# Patient Record
Sex: Female | Born: 1980 | Race: Black or African American | Hispanic: No | Marital: Married | Smoking: Never smoker
Health system: Southern US, Community
[De-identification: ages and names within clinical notes are randomized; demographics above are authoritative.]

## PROBLEM LIST (undated history)

## (undated) DIAGNOSIS — F419 Anxiety disorder, unspecified: Secondary | ICD-10-CM

## (undated) DIAGNOSIS — R030 Elevated blood-pressure reading, without diagnosis of hypertension: Secondary | ICD-10-CM

## (undated) DIAGNOSIS — E669 Obesity, unspecified: Secondary | ICD-10-CM

## (undated) DIAGNOSIS — E7800 Pure hypercholesterolemia, unspecified: Secondary | ICD-10-CM

## (undated) DIAGNOSIS — J302 Other seasonal allergic rhinitis: Secondary | ICD-10-CM

## (undated) DIAGNOSIS — S82899A Other fracture of unspecified lower leg, initial encounter for closed fracture: Secondary | ICD-10-CM

## (undated) DIAGNOSIS — C50919 Malignant neoplasm of unspecified site of unspecified female breast: Secondary | ICD-10-CM

## (undated) DIAGNOSIS — M2550 Pain in unspecified joint: Secondary | ICD-10-CM

## (undated) DIAGNOSIS — E559 Vitamin D deficiency, unspecified: Secondary | ICD-10-CM

## (undated) DIAGNOSIS — D219 Benign neoplasm of connective and other soft tissue, unspecified: Secondary | ICD-10-CM

## (undated) DIAGNOSIS — R112 Nausea with vomiting, unspecified: Secondary | ICD-10-CM

## (undated) DIAGNOSIS — Z9889 Other specified postprocedural states: Secondary | ICD-10-CM

## (undated) HISTORY — DX: Anxiety disorder, unspecified: F41.9

## (undated) HISTORY — DX: Obesity, unspecified: E66.9

## (undated) HISTORY — DX: Pain in unspecified joint: M25.50

## (undated) HISTORY — DX: Malignant neoplasm of unspecified site of unspecified female breast: C50.919

## (undated) HISTORY — DX: Pure hypercholesterolemia, unspecified: E78.00

## (undated) HISTORY — DX: Vitamin D deficiency, unspecified: E55.9

## (undated) HISTORY — PX: PARTIAL HYSTERECTOMY: SHX80

---

## 2000-02-02 ENCOUNTER — Encounter: Admission: RE | Admit: 2000-02-02 | Discharge: 2000-02-02 | Payer: Self-pay

## 2000-02-02 ENCOUNTER — Inpatient Hospital Stay (HOSPITAL_COMMUNITY): Admission: AD | Admit: 2000-02-02 | Discharge: 2000-02-02 | Payer: Self-pay | Admitting: Obstetrics

## 2000-03-20 ENCOUNTER — Inpatient Hospital Stay (HOSPITAL_COMMUNITY): Admission: AD | Admit: 2000-03-20 | Discharge: 2000-03-20 | Payer: Self-pay | Admitting: Obstetrics

## 2000-11-14 ENCOUNTER — Other Ambulatory Visit: Admission: RE | Admit: 2000-11-14 | Discharge: 2000-11-14 | Payer: Self-pay | Admitting: Gynecology

## 2001-03-15 ENCOUNTER — Inpatient Hospital Stay (HOSPITAL_COMMUNITY): Admission: AD | Admit: 2001-03-15 | Discharge: 2001-03-15 | Payer: Self-pay | Admitting: Gynecology

## 2001-11-11 ENCOUNTER — Other Ambulatory Visit: Admission: RE | Admit: 2001-11-11 | Discharge: 2001-11-11 | Payer: Self-pay | Admitting: *Deleted

## 2001-11-27 ENCOUNTER — Encounter: Payer: Self-pay | Admitting: *Deleted

## 2001-11-27 ENCOUNTER — Ambulatory Visit (HOSPITAL_COMMUNITY): Admission: RE | Admit: 2001-11-27 | Discharge: 2001-11-27 | Payer: Self-pay | Admitting: *Deleted

## 2002-05-02 ENCOUNTER — Emergency Department (HOSPITAL_COMMUNITY): Admission: EM | Admit: 2002-05-02 | Discharge: 2002-05-02 | Payer: Self-pay | Admitting: Emergency Medicine

## 2002-05-02 ENCOUNTER — Encounter: Payer: Self-pay | Admitting: *Deleted

## 2002-12-24 ENCOUNTER — Encounter: Payer: Self-pay | Admitting: *Deleted

## 2002-12-24 ENCOUNTER — Ambulatory Visit (HOSPITAL_COMMUNITY): Admission: RE | Admit: 2002-12-24 | Discharge: 2002-12-24 | Payer: Self-pay | Admitting: *Deleted

## 2003-07-08 ENCOUNTER — Encounter: Payer: Self-pay | Admitting: Gastroenterology

## 2003-07-08 ENCOUNTER — Encounter: Admission: RE | Admit: 2003-07-08 | Discharge: 2003-07-08 | Payer: Self-pay | Admitting: Gastroenterology

## 2003-11-06 DIAGNOSIS — S82899A Other fracture of unspecified lower leg, initial encounter for closed fracture: Secondary | ICD-10-CM

## 2003-11-06 HISTORY — DX: Other fracture of unspecified lower leg, initial encounter for closed fracture: S82.899A

## 2003-11-06 HISTORY — PX: ANKLE SURGERY: SHX546

## 2004-06-06 ENCOUNTER — Emergency Department (HOSPITAL_COMMUNITY): Admission: EM | Admit: 2004-06-06 | Discharge: 2004-06-06 | Payer: Self-pay

## 2004-06-12 ENCOUNTER — Ambulatory Visit (HOSPITAL_COMMUNITY): Admission: RE | Admit: 2004-06-12 | Discharge: 2004-06-12 | Payer: Self-pay | Admitting: Orthopedic Surgery

## 2005-03-20 IMAGING — CR DG ANKLE PORT 2V*R*
2 series · 2 of 2 positions shown · non-contrast
Comparison: none

CLINICAL DATA: Diffuse right ankle pain following a fall.  
RIGHT ANKLE PORTABLE TWO VIEWS
Portable AP and lateral view of the right ankle were obtained at 4353 hours.  These demonstrate anterior and medial subluxation of the tibia relative to the talus.  Also demonstrated is an oblique fracture of the distal fibula with one shaft width of posterior displacement and almost one shaft width of lateral displacement of the distal fragment as well as posterior and lateral angulation of the distal fragment.
IMPRESSION
Distal fibular fracture and tibial subluxation, as described above.

[view not recorded (1 of 2)]
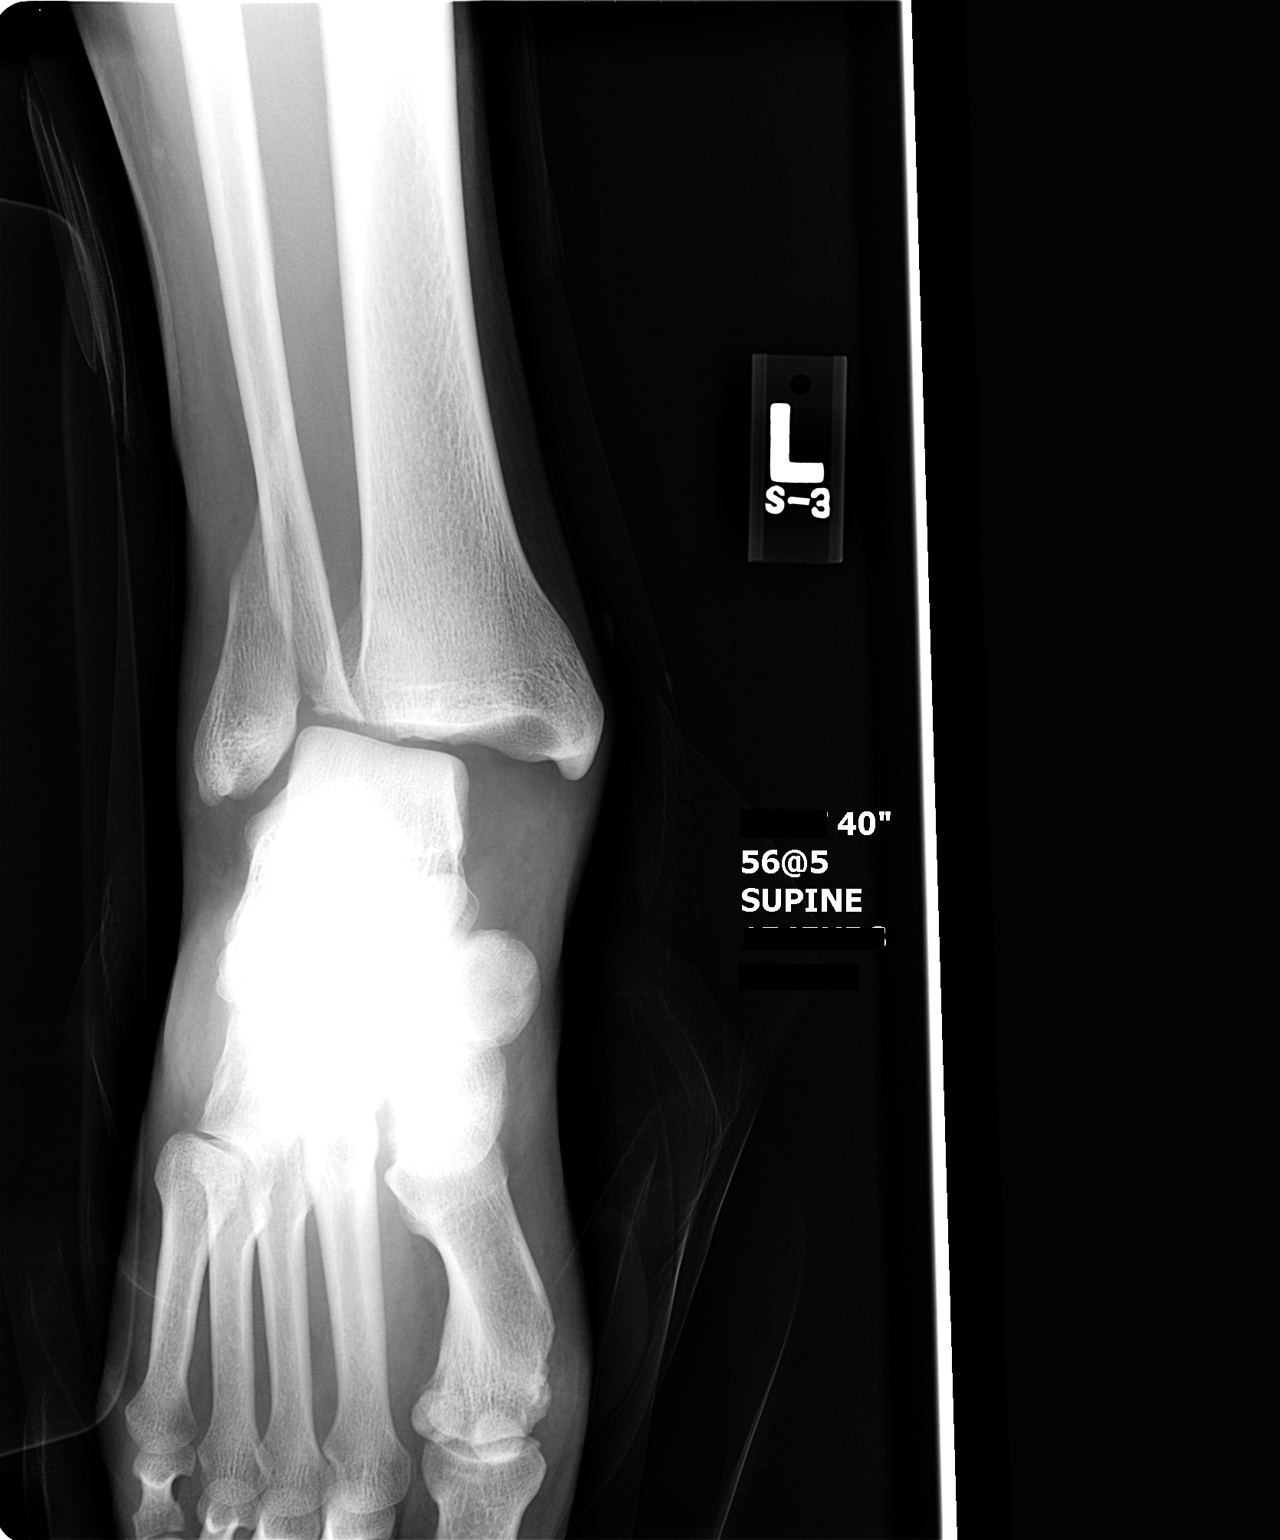

[view not recorded (2 of 2)]
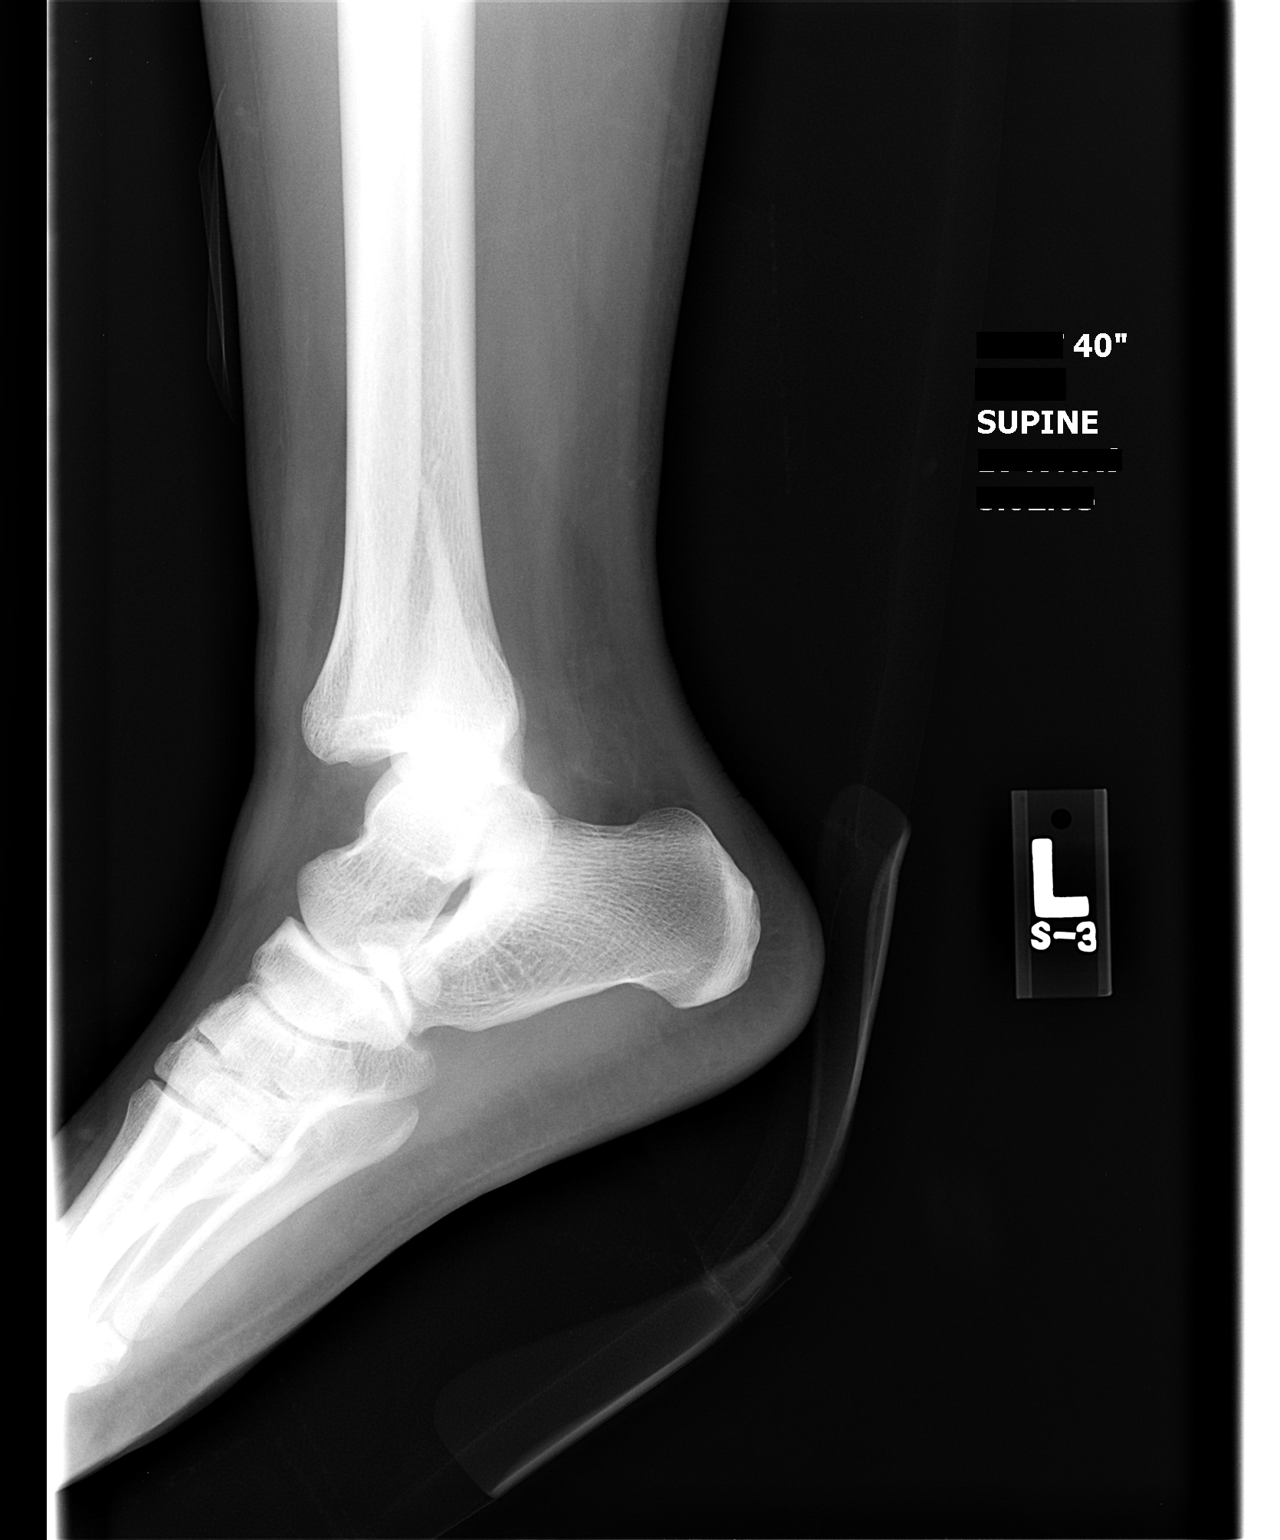

[2 of 2 positions shown; findings below may reference images not displayed]

## 2006-12-01 ENCOUNTER — Inpatient Hospital Stay (HOSPITAL_COMMUNITY): Admission: AD | Admit: 2006-12-01 | Discharge: 2006-12-02 | Payer: Self-pay | Admitting: Family Medicine

## 2006-12-10 ENCOUNTER — Encounter (INDEPENDENT_AMBULATORY_CARE_PROVIDER_SITE_OTHER): Payer: Self-pay | Admitting: Specialist

## 2006-12-10 ENCOUNTER — Inpatient Hospital Stay (HOSPITAL_COMMUNITY): Admission: AD | Admit: 2006-12-10 | Discharge: 2006-12-13 | Payer: Self-pay | Admitting: *Deleted

## 2008-03-17 ENCOUNTER — Other Ambulatory Visit: Admission: RE | Admit: 2008-03-17 | Discharge: 2008-03-17 | Payer: Self-pay | Admitting: Obstetrics and Gynecology

## 2009-03-25 ENCOUNTER — Other Ambulatory Visit: Admission: RE | Admit: 2009-03-25 | Discharge: 2009-03-25 | Payer: Self-pay | Admitting: Obstetrics and Gynecology

## 2010-08-29 ENCOUNTER — Inpatient Hospital Stay (HOSPITAL_COMMUNITY): Admission: RE | Admit: 2010-08-29 | Discharge: 2010-09-01 | Payer: Self-pay | Admitting: Obstetrics and Gynecology

## 2011-01-17 LAB — URINALYSIS, ROUTINE W REFLEX MICROSCOPIC
Bilirubin Urine: NEGATIVE
Glucose, UA: NEGATIVE mg/dL
Ketones, ur: NEGATIVE mg/dL
Nitrite: NEGATIVE
Protein, ur: NEGATIVE mg/dL
Urobilinogen, UA: 0.2 mg/dL (ref 0.0–1.0)
pH: 6 (ref 5.0–8.0)

## 2011-01-17 LAB — CBC
HCT: 37.8 % (ref 36.0–46.0)
MCH: 31.2 pg (ref 26.0–34.0)
MCHC: 34.2 g/dL (ref 30.0–36.0)
MCHC: 35 g/dL (ref 30.0–36.0)
MCV: 88.9 fL (ref 78.0–100.0)
RBC: 3.05 MIL/uL — ABNORMAL LOW (ref 3.87–5.11)
RDW: 14.9 % (ref 11.5–15.5)
WBC: 8.4 10*3/uL (ref 4.0–10.5)

## 2011-03-23 NOTE — Discharge Summary (Signed)
NAME:  Jessica Campos, Jessica Campos NO.:  [id number]   MEDICAL RECORD NO.:  [id number]          PATIENT TYPE:  INP   LOCATION:  9109                          FACILITY:  [facility]   PHYSICIAN:  Gerri Spore B. Earlene Plater, M.D.  DATE OF BIRTH:  1981/07/28   DATE OF ADMISSION:  12/10/2006  DATE OF DISCHARGE:  12/13/2006                               DISCHARGE SUMMARY   ADMISSION DIAGNOSES:  1. A 38-6/7 weeks intrauterine pregnancy.  2. Large uterine fibroid.  3. Severe abdominal pain.   DISCHARGE DIAGNOSES:  1. A 38-6/7 weeks intrauterine pregnancy.  2. Large uterine fibroid.  3. Severe abdominal pain.   PROCEDURE:  1. Admission for induction of labor.  2. Urgent primary C-section for nonreassuring fetal hear rate tracing      (prior to induction start).   HISTORY OF PRESENT ILLNESS:  A 30 year old African-American female  gravida 1 para 0 with a history of large symptomatic fibroid left fundal  region with associated left upper quadrant pain, had been seen in the  office prior to admission and given Vicodin for her pain which did not  resolve.  Given her proximity of 39 weeks, I recommend admission for  induction of labor.   HOSPITAL COURSE:  The patient was admitted to labor and delivery, placed  on the monitor and admission begun.  Prior to starting Pitocin, the  patient was noted to have a spontaneous fetal bradycardia to the 50s  which did recover; however, given the severe nature of the bradycardia  prior to any Pitocin being given, I recommended proceeding with a C-  section.  The patient in agreement.   Subsequently, the patient was delivered by a primary low transverse C-  section, viable female, Apgars 5 and 8, pH 7.08, 6 pounds 3 ounces.  Uterus noted to have multiple subserosus fibroids, the largest being 10  cm at the left cornu with no evidence for infarction.   Postoperatively, the patient rapidly regained her ability to ambulate,  void, and tolerate a regular diet.  She  was discharged home on third  postoperative day in satisfactory condition.   DISCHARGE MEDICATIONS:  Tylox 1-2 p.o. q.4-6h. p.r.n. pain.   FOLLOW UP:  Six weeks, [facility].   DISPOSITION:  Discharged satisfactory.   DISCHARGE INSTRUCTIONS:  Per booklet.      Gerri Spore B. Earlene Plater, M.D.  Electronically Signed     WBD/MEDQ  D:  01/02/2007  T:  01/02/2007  Job:  [id number]

## 2011-03-23 NOTE — Op Note (Signed)
NAME:  Jessica Campos, Jessica Campos NO.:  [id number]   MEDICAL RECORD NO.:  [id number]                   PATIENT TYPE:  OIB   LOCATION:  2899                                 FACILITY:  [facility]   PHYSICIAN:  Mila Homer. Sherlean Foot, M.D.              DATE OF BIRTH:  [birth year]   DATE OF PROCEDURE:  06/12/2004  DATE OF DISCHARGE:  06/12/2004                                 OPERATIVE REPORT   PREOPERATIVE DIAGNOSIS:  Right ankle lateral malleolus fracture.   POSTOPERATIVE DIAGNOSIS:  Right ankle lateral malleolus fracture.   OPERATION PERFORMED:  Right ankle open reduction internal fixation.   SURGEON:  Mila Homer. Sherlean Foot, M.D.   ASSISTANT:  Legrand Pitts. Duffy, P.A.   ANESTHESIA:  General.   INDICATIONS FOR PROCEDURE:  The patient is a young female who fell down the  stairs almost one week ago.  She was taken the emergency room, diagnosed  with an ankle fracture and sent to me.  This was actually a fracture  dislocation of the tibiotalar joint.  Saw her in my office on Friday and put  her on the schedule for today under informed consent.   DESCRIPTION OF PROCEDURE:  The patient was laid supine and administered  general anesthesia.  The right lower extremity was prepped and draped in the  usual sterile fashion.  Straight lateral incision was made with a #10 blade  directly over the fracture, 8 cm in length.  Sharp dissection was taken in  the distal 50% of the wound down to the bone and the proximal 50% used  Metzenbaum dissection to ensure we did not have any injury to the  superficial peroneal nerve.  Then I identified the fracture site and placed  a Bennett retractor anteriorly and posteriorly __________  the fracture.  I  reduced it with a bone reduction forceps.  At this point I placed a lag  screw in standard fashion.  At this point could remove the bone reduction  forceps and the reduction was maintained.  I then fashioned a 7 hole  semitubular plate to the lateral  cortex.  I then placed two cancellous  screws distally, three bicortical screws proximally.  I then evaluated under  C-arm.  The fracture was reduced anatomically; however, with some valgus  torque on the ankle, the medial side opened up but in the neutral position,  closed down and the mortise was anatomic.  I therefore, let the tourniquet  down, dressed with Adaptic, 4 x 4s, sterile Webril and then I put a  posterior and stirrup splint on the ankle with some inversion to help the  deltoid ligament not be under tension.   COMPLICATIONS:  None.   DRAINS:  None.   ESTIMATED BLOOD LOSS:  Minimal.   TOURNIQUET TIME:  33 minutes.  Mila Homer. Sherlean Foot, M.D.    SDL/MEDQ  D:  06/12/2004  T:  06/12/2004  Job:  [id number]

## 2011-03-23 NOTE — Op Note (Signed)
NAME:  Jessica Campos, Jessica Campos NO.:  [id number]   MEDICAL RECORD NO.:  [id number]          PATIENT TYPE:  INP   LOCATION:  9109                          FACILITY:  [facility]   PHYSICIAN:  Gerri Spore B. Earlene Plater, M.D.  DATE OF BIRTH:  11/27/1980   DATE OF PROCEDURE:  12/10/2006  DATE OF DISCHARGE:                               OPERATIVE REPORT   PREOPERATIVE DIAGNOSIS:  1. 38 6/7 week intrauterine pregnancy.  2. Abdominal pain with uterine fibroids.  3. Intermittent fetal bradycardia.   POSTOPERATIVE DIAGNOSIS:  1. 38 6/7 week intrauterine pregnancy.  2. Abdominal pain with uterine fibroids.  3. Intermittent fetal bradycardia.   PROCEDURE:  Primary low transverse cesarean section.   SURGEON:  Chester Holstein. Earlene Plater, M.D.   ASSISTANT:  Marlinda Mike, C.N.M.   ANESTHESIA:  Spinal.   SPECIMENS:  Placenta to pathology.   ESTIMATED BLOOD LOSS:  600 mL.   COMPLICATIONS:  None.   FINDINGS:  Viable female, Apgars 5 and 8, pH 7.08, 6 pounds 3 ounces.  Uterus with multiple subserosal fibroids, the largest being at the left  cornua about 10 cm in diameter with no evidence for infarction.  In  addition, there was a 3-4 cm fibroid in the upper portion of the lower  uterine segment in the midline.  Tubes and ovaries poorly visualized but  no abnormalities noted.   INDICATIONS FOR PROCEDURE:  The patient was admitted today for severe  upper abdominal pain presumably from uterine fibroids not responding to  narcotic medications and the plan was for induction of labor, however,  prior to induction of labor beginning, the patient was on the monitor in  labor and delivery and noted to have spontaneous intermittent fetal  bradycardia to as low as the 50s.  The heart rate did recover to the  130s and we, therefore, proceeded promptly but not as a stat and under  spinal.  The patient advised of the risks of surgery including  infection, bleeding, damage to surrounding organs.   DESCRIPTION OF  PROCEDURE:  The patient was taken to the operating room  and spinal anesthesia was obtained.  She was prepped and draped in a  standard fashion.  The bladder was emptied with a Foley catheter.  A  Pfannenstiel skin incision was made through the previous incision and  carried sharply to the fascia.  The fascia was divided sharply.  The  fascia was elevated and the underlying rectus muscles dissected off  sharply.  The posterior peritoneum elevated and entered sharply.  A  bladder blade was inserted and bladder flap created with sharply.   A uterine incision was made in a low transverse fashion with the knife.  Clear fluid amniotomy.  The incision was extended up laterally with  bandage scissors.  There was a subserosal fibroid about 3 cm cephalad to  the uterine incision in the midline.  The vertex was elevated and with  fundal pressure, the vertex immediately deflexed and would not deliver  through the incision despite the use of the mushroom cup vacuum which  was placed on the midline.  Therefore,  the skin incision was extended.  The rectus muscles also were divided about 50% on the left and about 75%  on the right.  Right Epigastric vessels were identified, clamped, and  divided.  The fascia was similarly extended.  This allowed delivery of  the infant without further difficulty.  In total, three pulls were  necessary with the mushroom cup and there were two pop-offs encountered.  The remainder of the infant was delivered without difficulty, the cord  clamped and cut, nose and mouth suctioned with the bulb, and the infant  handed off to the awaiting pediatricians.  1 gram of Ancef was given at  cord clamp.   The placenta was removed manually, the uterus left in the abdomen and  cleared of all clots and debris.  The uterine incision was free of  extension.  The uterine incision was closed in a running locked stitch  of 0 chromic.  A second imbricating layer was placed of the same  suture  with hemostasis obtained.  The pelvis was irrigated.  The uterine  incision and bladder flap were hemostatic.  The large subserosal fibroid  at the left cornua was inspected and was difficult to see as the patient  was vomiting profusely, however, when isolated and visualized did not  appear to be necrotic.   The fascia was closed with a running stitch of 0 Vicryl.  The  subcutaneous tissues were irrigated and made hemostatic with the Bovie.  The skin was closed with staples.  The patient tolerated the procedure  well and there were no complications.  She was taken to the recovery  room awake, alert, and in stable condition.  All counts were correct per  the operating room staff.      Gerri Spore B. Earlene Plater, M.D.  Electronically Signed     WBD/MEDQ  D:  12/10/2006  T:  12/10/2006  Job:  [id number]

## 2011-03-23 NOTE — Consult Note (Signed)
[facility]  Patient:    Jessica, Campos Visit Number: [id number] MRN: [id number]          Service Type: EMS Location: ED Attending Physician:  Ilene Qua Dictated by:   Candy Sledge, M.D. Proc. Date: 05/02/02 Admit Date:  05/02/2002 Discharge Date: 05/02/2002                            Consultation Report  REFERRING PHYSICIAN:  Earline Mayotte  CHIEF COMPLAINT:  Dizziness, numbness.  HISTORY OF PRESENT ILLNESS:  Jessica Campos is a 30 year old right-handed single female who has been in generally excellent health.  She was driving to work today around noon and started feeling weak and dizzy.  When she arrived at work she felt as though she were going to pass out and developed numbness of her face and hands.  She denies shortness of breath or palpitations at that time.  She had to be helped to the car by her colleagues and was brought to the emergency room.  She apparently was able to mobilize her extremities, however.  In the emergency room she was more "dizzy" without spinning vertigo and had some nausea.  The dizziness improved.  She developed a frontal headache.  The numbness has subsided as well.  The patient underwent an MRI and MRA specifically to rule out vertebral artery dissection and this showed no abnormalities.  Neurology was called to see the patient regarding her persistent dizziness.  The patients mother gives a history of intermittent dizziness since high school.  The patient has no history of migraines.  The mother does have a positive history of vertigo as well.  The patient admits to car sickness.  PAST MEDICAL HISTORY:  Otherwise negative, although she has been evaluated for an enlarged pituitary gland seen on CT scan.  She had an MRI of the brain in January 2003 and was advised to have yearly follow-up MRIs by her gynecologist.  She has had bunion surgery as well.  She takes birth control pills and no other  medications.  ALLERGIES:  There are no known drug allergies.  FAMILY HISTORY:  Positive for vertigo in the patients mother.  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  She does not smoke.  REVIEW OF SYSTEMS:  Generally as above.  The patient has no other complaints referable to the lungs, cardiovascular system, GI, or GU systems.  PHYSICAL EXAMINATION  GENERAL:  This is a generally well-developed, rather petite patient appearing somewhat uncomfortable lying in bed.  VITAL SIGNS:  Blood pressure 125/90, pulse 99 and regular, respirations 20, temperature 98.3.  HEENT:  Cranium is normocephalic and atraumatic.  NECK:  Supple without bruits.  HEART:  Regular rate and rhythm without murmurs.  LUNGS:  Clear to auscultation.  EXTREMITIES:  Without clubbing, cyanosis, edema.  NEUROLOGIC:  The patient is alert and oriented in all spheres.  She appears comfortable and avoids rapid head movements.  Her speech is normal.  Memory and judgment are both intact.  Affect is a bit flat and mood somewhat depressed.  Cranial nerve examination reveals full extraocular movements and a few beats of nystagmus on left lateral gaze.  Pupils are equal, round, and reactive to light and visual fields are full to confrontation.  Funduscopic examination reveals sharp disk margins.  Facies is symmetric and the tongue protrudes in the midline.  Motor testing  revealed negative ravarre.  There is 5/5 strength throughout the upper and lower extremities.  Deep tendon reflexes are 1+ and symmetric at all points and plantar responses are downgoing bilaterally.  Sensory examination is intact to primary modalities.  Cerebellar testing reveals good rapid alternating movements and finger-to-nose.  Gait was not tested.  As the patient sat up, she complained of increased symptoms.  LABORATORIES:  In the emergency room included a CBC, differential, comprehensive  metabolic panel, urinalysis, urine drug screen, and pregnancy test.  All of these were negative or within normal limits.  MRI of the brain and MR angiography of the intra and extracranial circulation showed no evidence of infarct, occlusions, dissection, deep vein thromboses. No specific abnormality of the pituitary gland was noted.  IMPRESSION:  Vertigo.  This could be a migraine equivalent.  The patient does have a history of vertigo since high school.  PLAN:  Discharge the patient with a prescription for Phenergan 25 mg tablets one-half to one q.4-6h. p.r.n. nausea and dizziness.  She is instructed to follow up on a p.r.n. basis with neurology and at some point may benefit from an ENT evaluation.  I appreciate the opportunity to evaluate this interesting patient. Dictated by:   Candy Sledge, M.D. Attending Physician:  Ilene Qua DD:  05/02/02 TD:  05/04/02 Job: [id number]

## 2011-11-06 HISTORY — PX: FOOT SURGERY: SHX648

## 2012-03-17 ENCOUNTER — Ambulatory Visit: Payer: Self-pay | Admitting: Obstetrics and Gynecology

## 2012-03-20 ENCOUNTER — Ambulatory Visit (INDEPENDENT_AMBULATORY_CARE_PROVIDER_SITE_OTHER): Payer: BC Managed Care – PPO | Admitting: Obstetrics and Gynecology

## 2012-03-20 ENCOUNTER — Encounter: Payer: Self-pay | Admitting: Obstetrics and Gynecology

## 2012-03-20 VITALS — BP 116/74 | Ht 63.0 in | Wt 162.0 lb

## 2012-03-20 DIAGNOSIS — Z124 Encounter for screening for malignant neoplasm of cervix: Secondary | ICD-10-CM

## 2012-03-20 NOTE — Progress Notes (Signed)
Last Pap: 2011 WNL: Yes Regular Periods:no Contraception: IUD Mirena 10/2010  Monthly Breast exam:no Tetanus<23yrs:yes Nl.Bladder Function:yes Daily BMs:yes Healthy Diet:yes Calcium:no Mammogram:no Exercise:yes Seatbelt: yes Abuse at home: no Stressful work:yes Sigmoid-colonoscopy: n/a Bone Density: No  PMH: No change FMH: No Change

## 2012-05-19 ENCOUNTER — Ambulatory Visit (INDEPENDENT_AMBULATORY_CARE_PROVIDER_SITE_OTHER): Payer: BC Managed Care – PPO | Admitting: Obstetrics and Gynecology

## 2012-05-19 ENCOUNTER — Encounter: Payer: Self-pay | Admitting: Obstetrics and Gynecology

## 2012-05-19 VITALS — BP 112/72 | Ht 63.0 in | Wt 163.0 lb

## 2012-05-19 DIAGNOSIS — A499 Bacterial infection, unspecified: Secondary | ICD-10-CM

## 2012-05-19 DIAGNOSIS — N760 Acute vaginitis: Secondary | ICD-10-CM | POA: Insufficient documentation

## 2012-05-19 DIAGNOSIS — Z124 Encounter for screening for malignant neoplasm of cervix: Secondary | ICD-10-CM

## 2012-05-19 DIAGNOSIS — Z01419 Encounter for gynecological examination (general) (routine) without abnormal findings: Secondary | ICD-10-CM

## 2012-05-19 DIAGNOSIS — D219 Benign neoplasm of connective and other soft tissue, unspecified: Secondary | ICD-10-CM | POA: Insufficient documentation

## 2012-05-19 DIAGNOSIS — E663 Overweight: Secondary | ICD-10-CM | POA: Insufficient documentation

## 2012-05-19 DIAGNOSIS — D259 Leiomyoma of uterus, unspecified: Secondary | ICD-10-CM

## 2012-05-19 NOTE — Progress Notes (Addendum)
The patient reports:normal menses, no abnormal bleeding, pelvic pain or discharge  Contraception:IUD  Last mammogram: patient has never had a mammogram  Last pap: was normal October  2011  GC/Chlamydia cultures offered: declined HIV/RPR/HbsAg offered:  declined HSV 1 and 2 glycoprotein offered: declined  Menstrual cycle regular and monthly: No: IUD  Menstrual flow normal: No: IUD   Urinary symptoms: none Normal bowel movements: Yes Reports abuse at home: No:   ANNUAL GYNECOLOGIC EXAMINATION   Jessica Campos is a 31 y.o. female, No obstetric history on file., who presents for an annual exam. See above. The patient has a known history of fibroids with the largest fibroid measured 4.2 cm.  A Mirena IUD was placed in December of 2011.  The string cannot be seen. She does not have periods.  She is confident that the IUD has not fallen out.  Prior Hysterectomy: No    History   Social History  . Marital Status: Married    Spouse Name: N/A    Number of Children: N/A  . Years of Education: N/A   Social History Main Topics  . Smoking status: Never Smoker   . Smokeless tobacco: None  . Alcohol Use:   . Drug Use:   . Sexually Active: Yes    Birth Control/ Protection: IUD     mirena 10/2010   Other Topics Concern  . None   Social History Narrative  . None    Menstrual cycle:   LMP: No LMP recorded. Patient is not currently having periods (Reason: IUD).           Cycle: No period with Mirena IUD.  The following portions of the patient's history were reviewed and updated as appropriate: allergies, current medications, past family history, past medical history, past social history, past surgical history and problem list.  Review of Systems Pertinent items are noted in HPI. Breast:Negative for breast lump,nipple discharge or nipple retraction Gastrointestinal: Negative for abdominal pain, change in bowel habits or rectal bleeding Urinary:negative   Objective:    BP 112/72   Ht 5\' 3"  (1.6 m)  Wt 163 lb (73.936 kg)  BMI 28.87 kg/m2    Weight:  Wt Readings from Last 1 Encounters:  05/19/12 163 lb (73.936 kg)          BMI: Body mass index is 28.87 kg/(m^2).  General Appearance: Alert, appropriate appearance for age. No acute distress HEENT: Grossly normal Neck / Thyroid: Supple, no masses, nodes or enlargement Lungs: clear to auscultation bilaterally Back: No CVA tenderness Breast Exam: No masses or nodes.No dimpling, nipple retraction or discharge. Cardiovascular: Regular rate and rhythm. S1, S2, no murmur Gastrointestinal: Soft, non-tender, no masses or organomegaly  ++++++++++++++++++++++++++++++++++++++++++++++++++++++++  Pelvic Exam: External genitalia: normal general appearance Vaginal: normal without tenderness, induration or masses and relaxation: No Cervix: normal appearance, no IUD string seen. Adnexa: normal bimanual exam Uterus: normal size, shape, and consistency Rectovaginal: not indicated  ++++++++++++++++++++++++++++++++++++++++++++++++++++++++  Lymphatic Exam: Non-palpable nodes in neck, clavicular, axillary, or inguinal regions Neurologic: Normal speech, no tremor  Psychiatric: Alert and oriented, appropriate affect.   Wet Prep:   not applicable Urinalysis:  not applicable UPT:           Not done   Assessment:    Normal gyn exam   Overweight or obese: Yes   Pelvic relaxation: No  Fibroids  Lost IUD string  Vaginosis   Plan:    pap smear return annually or prn Contraception:IUD  Patient declines ultrasound to identify  IUD.   Medications prescribed: Boric acid suppositories 600 mg twice each week, #20, refill for one year  STD screen request: none  RPR: No.   HBsAg: No.  Hepatitis C: No.  The updated Pap smear screening guidelines were discussed with the patient. The patient requested that I obtain a Pap smear: Yes.  Kegel exercises discussed: Yes.  Proper diet and regular exercise were  reviewed.  Annual mammograms recommended starting at age 97. Proper breast care was discussed.  Screening colonoscopy is recommended beginning at age 23.  Regular health maintenance was reviewed.  Sleep hygiene was discussed.  Adequate calcium and vitamin D intake was emphasized.  Mylinda Latina.D.

## 2012-05-20 LAB — PAP IG W/ RFLX HPV ASCU

## 2012-05-21 ENCOUNTER — Telehealth: Payer: Self-pay | Admitting: Obstetrics and Gynecology

## 2012-05-21 NOTE — Telephone Encounter (Signed)
TC to pt. LM to return call.  Need alternate pharmacy for Rx Dr AVS prescribed.

## 2012-05-21 NOTE — Telephone Encounter (Signed)
TC from pt. States will call back with pharmacy preference.

## 2012-05-28 ENCOUNTER — Telehealth: Payer: Self-pay | Admitting: Obstetrics and Gynecology

## 2012-05-28 ENCOUNTER — Other Ambulatory Visit: Payer: Self-pay | Admitting: Obstetrics and Gynecology

## 2012-05-28 MED ORDER — NONFORMULARY OR COMPOUNDED ITEM: Status: DC

## 2012-05-28 NOTE — Telephone Encounter (Signed)
TC to pt.  States is currently on crutches and cannot pick up Rx. Checked on delivery.  Pt then states will have husband obtain this weekend.  Rx to [facility].

## 2018-04-25 ENCOUNTER — Encounter (HOSPITAL_BASED_OUTPATIENT_CLINIC_OR_DEPARTMENT_OTHER): Payer: Self-pay | Admitting: *Deleted

## 2018-05-28 ENCOUNTER — Other Ambulatory Visit (HOSPITAL_COMMUNITY): Payer: Self-pay

## 2018-06-03 ENCOUNTER — Encounter (HOSPITAL_BASED_OUTPATIENT_CLINIC_OR_DEPARTMENT_OTHER): Payer: Self-pay

## 2018-06-03 ENCOUNTER — Ambulatory Visit (HOSPITAL_BASED_OUTPATIENT_CLINIC_OR_DEPARTMENT_OTHER): Admit: 2018-06-03 | Payer: Self-pay | Admitting: Obstetrics and Gynecology

## 2018-06-03 SURGERY — HYSTERECTOMY, VAGINAL, LAPAROSCOPY-ASSISTED, WITH SALPINGECTOMY
Anesthesia: General | Laterality: Bilateral

## 2018-09-26 ENCOUNTER — Other Ambulatory Visit: Payer: Self-pay | Admitting: Obstetrics and Gynecology

## 2018-10-09 ENCOUNTER — Encounter (HOSPITAL_COMMUNITY): Payer: Self-pay

## 2018-10-09 NOTE — Patient Instructions (Addendum)
Your procedure is scheduled on: Thursday, Dec. 12, 2019   Surgery Time:  11:45AM-2:45PM   Report to [facility] Main  Entrance    Report to admitting at 9:45 AM   Call this number if you have problems the morning of surgery [phone]   Do not eat food or drink liquids :After Midnight.   Brush your teeth the morning of surgery.   Do NOT smoke after Midnight   Take these medicines the morning of surgery with A SIP OF WATER: None                               You may not have any metal on your body including hair pins, jewelry, and body piercings             Do not wear make-up, lotions, powders, perfumes/cologne, or deodorant             Do not wear nail polish.  Do not shave  48 hours prior to surgery.                Do not bring valuables to the hospital. [place].   Contacts, dentures or bridgework may not be worn into surgery.   Leave suitcase in the car. After surgery it may be brought to your room.    Patients discharged the day of surgery will not be allowed to drive home.   Special Instructions: Bring a copy of your healthcare power of attorney and living will documents         the day of surgery if you haven't scanned them in before.              Please read over the following fact sheets you were given:  [facility] - Preparing for Surgery Before surgery, you can play an important role.  Because skin is not sterile, your skin needs to be as free of germs as possible.  You can reduce the number of germs on your skin by washing with CHG (chlorahexidine gluconate) soap before surgery.  CHG is an antiseptic cleaner which kills germs and bonds with the skin to continue killing germs even after washing. Please DO NOT use if you have an allergy to CHG or antibacterial soaps.  If your skin becomes reddened/irritated stop using the CHG and inform your nurse when you arrive at Short Stay. Do not shave (including  legs and underarms) for at least 48 hours prior to the first CHG shower.  You may shave your face/neck.  Please follow these instructions carefully:  1.  Shower with CHG Soap the night before surgery and the  morning of surgery.  2.  If you choose to wash your hair, wash your hair first as usual with your normal  shampoo.  3.  After you shampoo, rinse your hair and body thoroughly to remove the shampoo.                             4.  Use CHG as you would any other liquid soap.  You can apply chg directly to the skin and wash.  Gently with a scrungie or clean washcloth.  5.  Apply the CHG Soap to your body ONLY FROM THE NECK DOWN.   Do  not use on face/ open                           Wound or open sores. Avoid contact with eyes, ears mouth and   genitals (private parts).                       Wash face,  Genitals (private parts) with your normal soap.             6.  Wash thoroughly, paying special attention to the area where your    surgery  will be performed.  7.  Thoroughly rinse your body with warm water from the neck down.  8.  DO NOT shower/wash with your normal soap after using and rinsing off the CHG Soap.                9.  Pat yourself dry with a clean towel.            10.  Wear clean pajamas.            11.  Place clean sheets on your bed the night of your first shower and do not  sleep with pets. Day of Surgery : Do not apply any lotions/deodorants the morning of surgery.  Please wear clean clothes to the hospital/surgery center.  FAILURE TO FOLLOW THESE INSTRUCTIONS MAY RESULT IN THE CANCELLATION OF YOUR SURGERY  PATIENT SIGNATURE_________________________________  NURSE SIGNATURE__________________________________  ________________________________________________________________________

## 2018-10-13 ENCOUNTER — Encounter (HOSPITAL_COMMUNITY): Payer: Self-pay

## 2018-10-13 ENCOUNTER — Other Ambulatory Visit: Payer: Self-pay

## 2018-10-13 ENCOUNTER — Encounter (HOSPITAL_COMMUNITY)
Admission: RE | Admit: 2018-10-13 | Discharge: 2018-10-13 | Disposition: A | Discharge status: Home / Self Care | Payer: BC Managed Care – PPO | Source: Ambulatory Visit | Attending: Obstetrics and Gynecology | Admitting: Obstetrics and Gynecology

## 2018-10-13 DIAGNOSIS — Z01812 Encounter for preprocedural laboratory examination: Secondary | ICD-10-CM | POA: Insufficient documentation

## 2018-10-13 DIAGNOSIS — D259 Leiomyoma of uterus, unspecified: Secondary | ICD-10-CM | POA: Diagnosis not present

## 2018-10-13 HISTORY — DX: Benign neoplasm of connective and other soft tissue, unspecified: D21.9

## 2018-10-13 HISTORY — DX: Nausea with vomiting, unspecified: R11.2

## 2018-10-13 HISTORY — DX: Other specified postprocedural states: Z98.890

## 2018-10-13 HISTORY — DX: Other seasonal allergic rhinitis: J30.2

## 2018-10-13 HISTORY — DX: Elevated blood-pressure reading, without diagnosis of hypertension: R03.0

## 2018-10-13 HISTORY — DX: Other fracture of unspecified lower leg, initial encounter for closed fracture: S82.899A

## 2018-10-13 LAB — CBC
HEMATOCRIT: 38.8 % (ref 36.0–46.0)
Hemoglobin: 12.8 g/dL (ref 12.0–15.0)
MCH: 30.6 pg (ref 26.0–34.0)
MCHC: 33 g/dL (ref 30.0–36.0)
MCV: 92.8 fL (ref 80.0–100.0)
Platelets: 351 10*3/uL (ref 150–400)
RBC: 4.18 MIL/uL (ref 3.87–5.11)
RDW: 12.9 % (ref 11.5–15.5)
WBC: 5.2 10*3/uL (ref 4.0–10.5)
nRBC: 0 % (ref 0.0–0.2)

## 2018-10-13 LAB — BASIC METABOLIC PANEL
Anion gap: 8 (ref 5–15)
BUN: 10 mg/dL (ref 6–20)
CHLORIDE: 105 mmol/L (ref 98–111)
CO2: 27 mmol/L (ref 22–32)
Calcium: 9.2 mg/dL (ref 8.9–10.3)
Creatinine, Ser: 0.72 mg/dL (ref 0.44–1.00)
GFR calc Af Amer: >60 mL/min (ref >60)
GFR calc non Af Amer: >60 mL/min (ref >60)
Glucose, Bld: 68 mg/dL — ABNORMAL LOW (ref 70–99)
POTASSIUM: 4.3 mmol/L (ref 3.5–5.1)
Sodium: 140 mmol/L (ref 135–145)

## 2018-10-13 NOTE — H&P (Signed)
Jessica Campos is a 37 y.o.  P: 2-0-0-2  who presents for hysterectomy because of menorrhagia, symptomatic fibroids and dysmenorrhea.  Prior to 2 years ago,  the patient was amenorrheic due to having a Mirena IUD.  Her most recent replacement of the Mirena IUD  was August 2018 and since that time she has experienced heavy  and prolonged vaginal bleeding.  Her flow will last for 2-7 weeks requiring, on her heaviest days,  the change of a pad every 2 hours.  She goes on to report pelvic pain  rated 7/10 on a 10 point pain scale, that is worse on the days she bleeds heavy.  Fortunately she has some relief with Ibuprofen 800 mg  (decreases to 4/10) and has used Tramadol for breakthrough pain.  Along with her menstrual symptoms the patient has a constant sensation of pelvic pressure, occasional urinary hesitancy, lower back pain and positional dyspareunia.  She denies any changes however, in her bowel function.  A pelvic ultrasound in November 2019 showed: an anteverted,  fibroid laden uterus measuring 10.7 cm from fundus to external os: actual dimensions were: 7.24 x 6.23 x 8.43 cm, endometrium: 6.52 mm with IUD in LUS/cervix  #7 fibroids: left fundal sub-serosal-4.07 cm; #2 left anterior intramural-1.70 and 1.61 cm; posterior mid-intramural-3.14 cm; calcified posterior sub-serosal fundal-6.06 cm, right anterior fundal subserosal-1.61 cm and right intramural-2.28 cm; right ovary-3.35 cm and left ovary-2.63 cm.   A review of both medical and surgical management options were given to the patient however,  she desires definitive therapy in the form of hysterectomy.   Past Medical History  OB History: G: 2;  P: 2-0-0-2;  C-section 2008 & 2011  GYN History: menarche: 37 YO    LMP: see HPI    Contraception: Mirena IUD   Denies history of abnormal PAP smear or sexually transmitted diseases.   Last PAP smear: 2019-normal with Negative HPV  Medical History: Negative  Surgical History: 1985 Left Eye Surgery;  1997  Left Foot Cystectomy;  1999 Right Foot Bunionectomy;  2013 Right Foot Bone Spur Removal Denies problems with anesthesia or history of blood transfusions  Family History: Diabetes Mellitus, Stroke and Hypertension  Social History: Married and employed as a Scientist, research (physical sciences);  Denies tobacco use and occasionally uses alcohol  Medications: Ibuprofen 800 mg with food every 8 hours prn Mirena IUD  (placed 06/11/17) Zyrtec 5 mg daily prn  No Known Allergies   Denies sensitivity to peanuts, shellfish, soy, latex or adhesives.   ROS: Admits to glasses and occasional urinary hesitancy but denies  headache, vision changes, nasal congestion, dysphagia, tinnitus, dizziness, hoarseness, cough,  chest pain, shortness of breath, nausea, vomiting, diarrhea,constipation,  urinary frequency, urgency  dysuria, hematuria, vaginitis symptoms,  swelling of joints,easy bruising,  myalgias, arthralgias, skin rashes, unexplained weight loss and except as is mentioned in the history of present illness, patient's review of systems is otherwise negative.     Physical Exam  Bp:110/70  P: 66 bpm   R: 16  Temperature: 98.6 degrees F orally    O2Sat: 99% (room air) Weight: 170lbs Height: 5'2"   BMI: 31.1  Neck: supple without masses or thyromegaly Lungs: clear to auscultation Heart: regular rate and rhythm Abdomen: soft, mildly tender in supra-pubic area and no organomegaly Pelvic:EGBUS- wnl; vagina-normal rugae; uterus-irregular and 12-14 weeks  size, cervix without lesions or motion tenderness; adnexae-no tenderness or masses Extremities:  no clubbing, cyanosis or edema   Assesment: Menorrhagia  Dysmenorrhea                      Symptomatic Uterine Fibroids   Disposition: Robotically-assisted hysterectomy was reviewed with the patient.    Benefits of the robotic approach include lesser postoperative pain, less blood loss during surgery, reduced risk of injury to other organs due to  better visualization with a 3-D HD 10 times magnifying camera, shorter hospital stay between 0-1 night and rapid recovery with return to daily routine in 2-3 weeks. Although robotically-assisted hysterectomy has a longer operative time than traditional laparotomy, in a patient with good medical history, the benefits usually outweigh the risks.    Risks include bleeding, infection, injury to other organs, need for laparotomy, transient post-operative facial edema, increased risk of pelvic prolapse associated with any hysterectomy as well as earlier onset of menopause. Preservation or preventative removal of the ovaries was also reviewed and left to the patient's discretion. Finally, the option of supracervical hysterectomy was also discussed with the possible but yet unconfirmed benefits of reduction of pelvic prolapse. If supracervical hysterectomy is performed, Pap smear screening would continue as currently recommended, monthly bleeding is possible despite cauterization of cervical canal and a small but possible risk of cervical fibroid development is present.   Patient informed about FDA warning on use of morcellator dated 02/19/2013.   Discussed: 1. Incidence of post-operative diagnosis of sarcoma in women undergoing a hysterectomy is 2:1000 2. Annual incidence of leiomyosarcoma is 0.64/100,000 women 3. Sarcomas have the highest incidence in women over 65 4. Power morcellation involves risks of spreading tissue / disease. In he case of undiagnosed cancer, it may adversely affect the patient's prognosis.    Patient verbalized  understanding of these risks.  she was given a Miralax Bowel Prep to be completed the day before her surgery.  She has consented to proceed with a Roboticallly Assisted Total Laparoscopic Hysterectomy with Bilateral Salpingectomy and Possible Uterine Morcellation at [facility] on October 16, 2018.      CSN# [id number]   Levander Katzenstein J. Florene Glen, PA-C  for Dr.  Dede Query. Rivard

## 2018-10-13 NOTE — Pre-Procedure Instructions (Signed)
BMP results 10/13/2018 sent to Dr. Cletis Media via epic.

## 2018-10-16 ENCOUNTER — Ambulatory Visit (HOSPITAL_COMMUNITY)
Admission: RE | Admit: 2018-10-16 | Discharge: 2018-10-17 | Disposition: A | Discharge status: Home / Self Care | Payer: BC Managed Care – PPO | Source: Ambulatory Visit | Attending: Obstetrics and Gynecology | Admitting: Obstetrics and Gynecology

## 2018-10-16 ENCOUNTER — Ambulatory Visit (HOSPITAL_BASED_OUTPATIENT_CLINIC_OR_DEPARTMENT_OTHER): Payer: BC Managed Care – PPO | Admitting: Anesthesiology

## 2018-10-16 ENCOUNTER — Encounter (HOSPITAL_COMMUNITY)
Admission: RE | Disposition: A | Discharge status: Home / Self Care | Payer: Self-pay | Source: Ambulatory Visit | Attending: Obstetrics and Gynecology

## 2018-10-16 ENCOUNTER — Encounter (HOSPITAL_COMMUNITY): Payer: Self-pay | Admitting: *Deleted

## 2018-10-16 ENCOUNTER — Other Ambulatory Visit: Payer: Self-pay

## 2018-10-16 DIAGNOSIS — N920 Excessive and frequent menstruation with regular cycle: Secondary | ICD-10-CM | POA: Diagnosis not present

## 2018-10-16 DIAGNOSIS — N736 Female pelvic peritoneal adhesions (postinfective): Secondary | ICD-10-CM | POA: Diagnosis not present

## 2018-10-16 DIAGNOSIS — N946 Dysmenorrhea, unspecified: Secondary | ICD-10-CM | POA: Diagnosis not present

## 2018-10-16 DIAGNOSIS — N838 Other noninflammatory disorders of ovary, fallopian tube and broad ligament: Secondary | ICD-10-CM | POA: Insufficient documentation

## 2018-10-16 DIAGNOSIS — D219 Benign neoplasm of connective and other soft tissue, unspecified: Secondary | ICD-10-CM

## 2018-10-16 DIAGNOSIS — N921 Excessive and frequent menstruation with irregular cycle: Secondary | ICD-10-CM

## 2018-10-16 DIAGNOSIS — D259 Leiomyoma of uterus, unspecified: Secondary | ICD-10-CM | POA: Diagnosis present

## 2018-10-16 DIAGNOSIS — Z793 Long term (current) use of hormonal contraceptives: Secondary | ICD-10-CM | POA: Diagnosis not present

## 2018-10-16 LAB — PREGNANCY, URINE: Preg Test, Ur: NEGATIVE

## 2018-10-16 SURGERY — XI ROBOTIC ASSISTED TOTAL HYSTERECTOMY WITH SALPINGECTOMY
Anesthesia: General | Laterality: Bilateral

## 2018-10-16 MED ORDER — LACTATED RINGERS IV SOLN
INTRAVENOUS | Status: DC
  Administered 2018-10-09: via INTRAVENOUS

## 2018-10-16 MED ORDER — OXYCODONE-ACETAMINOPHEN 5-325 MG PO TABS: 1.0000 | ORAL_TABLET | Freq: Four times a day (QID) | ORAL | 0 refills | Status: AC | PRN

## 2018-10-16 MED ORDER — KETOROLAC TROMETHAMINE 30 MG/ML IJ SOLN
INTRAMUSCULAR | Status: DC | PRN
  Administered 2018-10-16: 30 mg via INTRAMUSCULAR
  Administered 2018-10-16: 30 mg via INTRAVENOUS

## 2018-10-16 MED ORDER — ONDANSETRON HCL 4 MG/2ML IJ SOLN
4.0000 mg | Freq: Once | INTRAMUSCULAR | Status: AC | PRN
  Administered 2018-10-16: 4 mg via INTRAVENOUS

## 2018-10-16 MED ORDER — FENTANYL CITRATE (PF) 100 MCG/2ML IJ SOLN
INTRAMUSCULAR | Status: DC | PRN
  Administered 2018-10-16: 100 ug via INTRAVENOUS
  Administered 2018-10-16: 150 ug via INTRAVENOUS
  Administered 2018-10-16: 100 ug via INTRAVENOUS

## 2018-10-16 MED ORDER — ONDANSETRON HCL 4 MG/2ML IJ SOLN
INTRAMUSCULAR | Status: DC | PRN
  Administered 2018-10-16: 4 mg via INTRAVENOUS

## 2018-10-16 MED ORDER — LACTATED RINGERS IV SOLN
INTRAVENOUS | Status: DC
  Administered 2018-10-16 (×2): via INTRAVENOUS

## 2018-10-16 MED ORDER — LIDOCAINE HCL (CARDIAC) PF 100 MG/5ML IV SOSY
PREFILLED_SYRINGE | INTRAVENOUS | Status: DC | PRN
  Administered 2018-10-16: 80 mg via INTRAVENOUS

## 2018-10-16 MED ORDER — FENTANYL CITRATE (PF) 100 MCG/2ML IJ SOLN
INTRAMUSCULAR | Status: AC
  Administered 2018-10-16: 50 ug via INTRAVENOUS
  Filled 2018-10-16: qty 2

## 2018-10-16 MED ORDER — ONDANSETRON HCL 4 MG/2ML IJ SOLN: 4.0000 mg | Freq: Four times a day (QID) | INTRAMUSCULAR | Status: DC | PRN

## 2018-10-16 MED ORDER — FENTANYL CITRATE (PF) 100 MCG/2ML IJ SOLN
25.0000 ug | INTRAMUSCULAR | Status: DC | PRN
  Administered 2018-10-16: 50 ug via INTRAVENOUS

## 2018-10-16 MED ORDER — SODIUM CHLORIDE 0.9 % IV SOLN
2.0000 g | INTRAVENOUS | Status: AC
  Administered 2018-10-16: 2 g via INTRAVENOUS
  Filled 2018-10-16: qty 2

## 2018-10-16 MED ORDER — FENTANYL CITRATE (PF) 250 MCG/5ML IJ SOLN
INTRAMUSCULAR | Status: AC
  Filled 2018-10-16: qty 5

## 2018-10-16 MED ORDER — ONDANSETRON HCL 4 MG/2ML IJ SOLN
INTRAMUSCULAR | Status: AC
  Filled 2018-10-16: qty 2

## 2018-10-16 MED ORDER — PROPOFOL 10 MG/ML IV BOLUS
INTRAVENOUS | Status: AC
  Filled 2018-10-16: qty 40

## 2018-10-16 MED ORDER — IBUPROFEN 600 MG PO TABS: ORAL_TABLET | ORAL | 1 refills | Status: DC

## 2018-10-16 MED ORDER — MENTHOL 3 MG MT LOZG: 1.0000 | LOZENGE | OROMUCOSAL | Status: DC | PRN

## 2018-10-16 MED ORDER — STERILE WATER FOR IRRIGATION IR SOLN
Status: DC | PRN
  Administered 2018-10-16: 1000 mL via INTRAVESICAL

## 2018-10-16 MED ORDER — SUGAMMADEX SODIUM 200 MG/2ML IV SOLN
INTRAVENOUS | Status: DC | PRN
  Administered 2018-10-16: 200 mg via INTRAVENOUS

## 2018-10-16 MED ORDER — SIMETHICONE 80 MG PO CHEW
80.0000 mg | CHEWABLE_TABLET | Freq: Four times a day (QID) | ORAL | Status: DC | PRN
  Administered 2018-10-16: 80 mg via ORAL
  Filled 2018-10-16: qty 1

## 2018-10-16 MED ORDER — MIDAZOLAM HCL 2 MG/2ML IJ SOLN
INTRAMUSCULAR | Status: AC
  Filled 2018-10-16: qty 2

## 2018-10-16 MED ORDER — PROPOFOL 10 MG/ML IV BOLUS
INTRAVENOUS | Status: DC | PRN
  Administered 2018-10-16: 140 mg via INTRAVENOUS

## 2018-10-16 MED ORDER — DEXAMETHASONE SODIUM PHOSPHATE 10 MG/ML IJ SOLN
INTRAMUSCULAR | Status: DC | PRN
  Administered 2018-10-16: 5 mg via INTRAVENOUS

## 2018-10-16 MED ORDER — PROMETHAZINE HCL 25 MG/ML IJ SOLN: 25.0000 mg | Freq: Once | INTRAMUSCULAR | Status: DC

## 2018-10-16 MED ORDER — FENTANYL CITRATE (PF) 100 MCG/2ML IJ SOLN
50.0000 ug | Freq: Once | INTRAMUSCULAR | Status: AC
  Administered 2018-10-16: 50 ug via INTRAVENOUS
  Filled 2018-10-16: qty 2

## 2018-10-16 MED ORDER — MIDAZOLAM HCL 5 MG/5ML IJ SOLN
INTRAMUSCULAR | Status: DC | PRN
  Administered 2018-10-16: 2 mg via INTRAVENOUS

## 2018-10-16 MED ORDER — PHENYLEPHRINE HCL 10 MG/ML IJ SOLN
INTRAMUSCULAR | Status: DC | PRN
  Administered 2018-10-16 (×8): 100 ug via INTRAVENOUS

## 2018-10-16 MED ORDER — ONDANSETRON HCL 4 MG PO TABS: 4.0000 mg | ORAL_TABLET | Freq: Three times a day (TID) | ORAL | Status: DC | PRN

## 2018-10-16 MED ORDER — FENTANYL CITRATE (PF) 100 MCG/2ML IJ SOLN
INTRAMUSCULAR | Status: AC
  Filled 2018-10-16: qty 2

## 2018-10-16 MED ORDER — SODIUM CHLORIDE 0.9 % IV SOLN
INTRAVENOUS | Status: DC | PRN
  Administered 2018-10-16: 50 mL
  Administered 2018-10-16: 60 mL
  Administered 2018-10-16: 10 mL

## 2018-10-16 MED ORDER — ROCURONIUM BROMIDE 100 MG/10ML IV SOLN
INTRAVENOUS | Status: DC | PRN
  Administered 2018-10-16: 30 mg via INTRAVENOUS
  Administered 2018-10-16: 50 mg via INTRAVENOUS
  Administered 2018-10-16: 20 mg via INTRAVENOUS

## 2018-10-16 MED ORDER — SODIUM CHLORIDE 0.9 % IR SOLN
Status: DC | PRN
  Administered 2018-10-16: 3000 mL

## 2018-10-16 MED ORDER — OXYCODONE-ACETAMINOPHEN 5-325 MG PO TABS
1.0000 | ORAL_TABLET | ORAL | Status: DC | PRN
  Administered 2018-10-16 – 2018-10-17 (×3): 1 via ORAL
  Filled 2018-10-16 (×3): qty 1

## 2018-10-16 MED ORDER — PROMETHAZINE HCL 25 MG/ML IJ SOLN
12.5000 mg | Freq: Once | INTRAMUSCULAR | Status: AC
  Administered 2018-10-16: 12.5 mg via INTRAVENOUS
  Filled 2018-10-16: qty 1

## 2018-10-16 SURGICAL SUPPLY — 59 items
BARRIER ADHS 3X4 INTERCEED (GAUZE/BANDAGES/DRESSINGS) ×3 IMPLANT
BLADE LAP MORCELLATOR 15MMX9.5 (ELECTROSURGICAL)
BLADE LAP MORCELLATOR 15X9.5 (ELECTROSURGICAL) IMPLANT
BLADE MORCELLATOR EXT  12.5X15 (ELECTROSURGICAL)
BLADE MORCELLATOR EXT 12.5X15 (ELECTROSURGICAL) IMPLANT
CANISTER SUCT 3000ML PPV (MISCELLANEOUS) ×3 IMPLANT
CATH FOLEY 3WAY  5CC 16FR (CATHETERS) ×2
CATH FOLEY 3WAY 5CC 16FR (CATHETERS) ×1 IMPLANT
CLOSURE WOUND 1/4 X3 (GAUZE/BANDAGES/DRESSINGS) ×1
COVER BACK TABLE 60X90IN (DRAPES) ×3 IMPLANT
COVER SURGICAL LIGHT HANDLE (MISCELLANEOUS) ×3 IMPLANT
COVER TIP SHEARS 8 DVNC (MISCELLANEOUS) ×1 IMPLANT
COVER TIP SHEARS 8MM DA VINCI (MISCELLANEOUS) ×2
DECANTER SPIKE VIAL GLASS SM (MISCELLANEOUS) ×6 IMPLANT
DEFOGGER SCOPE WARMER CLEARIFY (MISCELLANEOUS) ×3 IMPLANT
DERMABOND ADVANCED (GAUZE/BANDAGES/DRESSINGS) ×2
DERMABOND ADVANCED .7 DNX12 (GAUZE/BANDAGES/DRESSINGS) ×1 IMPLANT
DRAPE ARM DVNC X/XI (DISPOSABLE) ×4 IMPLANT
DRAPE COLUMN DVNC XI (DISPOSABLE) ×1 IMPLANT
DRAPE DA VINCI XI ARM (DISPOSABLE) ×8
DRAPE DA VINCI XI COLUMN (DISPOSABLE) ×2
DURAPREP 26ML APPLICATOR (WOUND CARE) ×3 IMPLANT
ELECT REM PT RETURN 9FT ADLT (ELECTROSURGICAL) ×3
ELECTRODE REM PT RTRN 9FT ADLT (ELECTROSURGICAL) ×1 IMPLANT
GLOVE BIO SURGEON STRL SZ7 (GLOVE) ×3 IMPLANT
GLOVE BIOGEL PI IND STRL 7.0 (GLOVE) ×5 IMPLANT
GLOVE BIOGEL PI INDICATOR 7.0 (GLOVE) ×10
GLOVE ECLIPSE 6.5 STRL STRAW (GLOVE) ×9 IMPLANT
IRRIG SUCT STRYKERFLOW 2 WTIP (MISCELLANEOUS) ×3
IRRIGATION SUCT STRKRFLW 2 WTP (MISCELLANEOUS) ×1 IMPLANT
LEGGING LITHOTOMY PAIR STRL (DRAPES) ×3 IMPLANT
OBTURATOR OPTICAL STANDARD 8MM (TROCAR) ×2
OBTURATOR OPTICAL STND 8 DVNC (TROCAR) ×1
OBTURATOR OPTICALSTD 8 DVNC (TROCAR) ×1 IMPLANT
OCCLUDER COLPOPNEUMO (BALLOONS) ×3 IMPLANT
PACK ROBOT WH (CUSTOM PROCEDURE TRAY) ×3 IMPLANT
PACK ROBOTIC GOWN (GOWN DISPOSABLE) ×3 IMPLANT
PACK TRENDGUARD 450 HYBRID PRO (MISCELLANEOUS) ×1 IMPLANT
PAD PREP 24X48 CUFFED NSTRL (MISCELLANEOUS) ×3 IMPLANT
PROTECTOR NERVE ULNAR (MISCELLANEOUS) ×9 IMPLANT
SEAL CANN UNIV 5-8 DVNC XI (MISCELLANEOUS) ×4 IMPLANT
SEAL XI 5MM-8MM UNIVERSAL (MISCELLANEOUS) ×8
SEALER VESSEL DA VINCI XI (MISCELLANEOUS)
SEALER VESSEL EXT DVNC XI (MISCELLANEOUS) IMPLANT
SET CYSTO W/LG BORE CLAMP LF (SET/KITS/TRAYS/PACK) ×3 IMPLANT
SET TRI-LUMEN FLTR TB AIRSEAL (TUBING) ×3 IMPLANT
STRIP CLOSURE SKIN 1/4X3 (GAUZE/BANDAGES/DRESSINGS) ×2 IMPLANT
SUT MNCRL AB 3-0 PS2 18 (SUTURE) ×6 IMPLANT
SUT MNCRL AB 3-0 PS2 27 (SUTURE) ×6 IMPLANT
SUT VIC AB 0 CT1 27 (SUTURE) ×4
SUT VIC AB 0 CT1 27XBRD ANBCTR (SUTURE) ×2 IMPLANT
SUT VICRYL 0 UR6 27IN ABS (SUTURE) ×3 IMPLANT
SUT VLOC 180 0 9IN  GS21 (SUTURE) ×8
SUT VLOC 180 0 9IN GS21 (SUTURE) ×4 IMPLANT
TIP UTERINE 6.7X10CM GRN DISP (MISCELLANEOUS) ×3 IMPLANT
TOWEL OR 17X26 10 PK STRL BLUE (TOWEL DISPOSABLE) ×3 IMPLANT
TRENDGUARD 450 HYBRID PRO PACK (MISCELLANEOUS) ×3
TROCAR PORT AIRSEAL 8X120 (TROCAR) ×3 IMPLANT
WATER STERILE IRR 1000ML POUR (IV SOLUTION) ×3 IMPLANT

## 2018-10-16 NOTE — Progress Notes (Signed)
Phone call to Dr. Cletis Media per request for update on patient status/condition. Informed Dr. Cletis Media of patient's lethargy and continued nausea, poor PO intake with only sips and ice chips. Discussed medication for pain with percocet per order and use of perineal ice pack and Kpad for abdominal pain. Also informed Dr. Cletis Media of patient only getting OOB to void and report from day shift that patient had one walk in hallway only d/t vomiting. Will continue to monitor.

## 2018-10-16 NOTE — Anesthesia Postprocedure Evaluation (Signed)
Anesthesia Post Note  Patient: Jessica Campos  Procedure(s) Performed: XI ROBOTIC ASSISTED TOTAL HYSTERECTOMY WITH SALPINGECTOMY LYSIS OF ADHESIONS (Bilateral )     Patient location during evaluation: PACU Anesthesia Type: General Level of consciousness: awake and alert Pain management: pain level controlled Vital Signs Assessment: post-procedure vital signs reviewed and stable Respiratory status: spontaneous breathing, nonlabored ventilation, respiratory function stable and patient connected to nasal cannula oxygen Cardiovascular status: blood pressure returned to baseline and stable Postop Assessment: no apparent nausea or vomiting Anesthetic complications: no    Last Vitals:  Vitals:   10/16/18 1700 10/16/18 1727  BP: 120/87 (!) 155/96  Pulse: 66 (!) 58  Resp: 17 16  Temp: 36.8 C (!) 36.3 C  SpO2: 98% 99%    Last Pain:  Vitals:   10/16/18 1727  TempSrc: Oral  PainSc: 5                  Owain Eckerman L Teyona Nichelson

## 2018-10-16 NOTE — Anesthesia Preprocedure Evaluation (Addendum)
Anesthesia Evaluation  Patient identified by MRN, date of birth, ID band Patient awake    Reviewed: Allergy & Precautions, NPO status , Patient's Chart, lab work & pertinent test results  History of Anesthesia Complications (+) PONV  Airway Mallampati: II  TM Distance: >3 FB Neck ROM: Full    Dental  (+) Dental Advisory Given   Pulmonary neg pulmonary ROS,    breath sounds clear to auscultation       Cardiovascular negative cardio ROS   Rhythm:Regular Rate:Normal     Neuro/Psych negative neurological ROS     GI/Hepatic negative GI ROS, Neg liver ROS,   Endo/Other  negative endocrine ROS  Renal/GU negative Renal ROS     Musculoskeletal   Abdominal   Peds  Hematology negative hematology ROS (+)   Anesthesia Other Findings   Reproductive/Obstetrics                           Lab Results  Component Value Date   WBC 5.2 10/13/2018   HGB 12.8 10/13/2018   HCT 38.8 10/13/2018   MCV 92.8 10/13/2018   PLT 351 10/13/2018   Lab Results  Component Value Date   CREATININE 0.72 10/13/2018   BUN 10 10/13/2018   NA 140 10/13/2018   K 4.3 10/13/2018   CL 105 10/13/2018   CO2 27 10/13/2018    Anesthesia Physical Anesthesia Plan  ASA: II  Anesthesia Plan: General   Post-op Pain Management:    Induction: Intravenous  PONV Risk Score and Plan: 4 or greater and Dexamethasone, Ondansetron, Treatment may vary due to age or medical condition, Scopolamine patch - Pre-op and Midazolam  Airway Management Planned: Oral ETT  Additional Equipment:   Intra-op Plan:   Post-operative Plan: Extubation in OR  Informed Consent: I have reviewed the patients History and Physical, chart, labs and discussed the procedure including the risks, benefits and alternatives for the proposed anesthesia with the patient or authorized representative who has indicated his/her understanding and acceptance.    Dental advisory given  Plan Discussed with: CRNA  Anesthesia Plan Comments:        Anesthesia Quick Evaluation

## 2018-10-16 NOTE — Discharge Instructions (Signed)
Call [facility] @ [phone] if: ° °You have a temperature greater than or equal to 100.4 degrees Farenheit orally °You have pain that is not made better by the pain medication given and taken as directed °You have excessive bleeding or problems urinating ° °Take Colace (Docusate Sodium/Stool Softener) 100 mg 2-3 times daily while taking narcotic pain medicine to avoid constipation or until bowel movements are regular. °Take Ibuprofen 600 mg with food every 6 hours for 5 days then as needed for pain ° °You may drive after 1 week °You may walk up steps ° °You may shower tomorrow °You may resume a regular diet ° °Keep incisions clean and dry °Do not lift over 15 pounds for 6 weeks °Avoid anything in vagina for 6 weeks (or until after your post-operative visit) ° °

## 2018-10-16 NOTE — Transfer of Care (Signed)
Immediate Anesthesia Transfer of Care Note  Patient: Jessica Campos  Procedure(s) Performed: XI ROBOTIC ASSISTED TOTAL HYSTERECTOMY WITH SALPINGECTOMY LYSIS OF ADHESIONS (Bilateral )  Patient Location: PACU  Anesthesia Type:General  Level of Consciousness: awake, alert  and oriented  Airway & Oxygen Therapy: Patient Spontanous Breathing and Patient connected to face mask oxygen  Post-op Assessment: Report given to RN and Post -op Vital signs reviewed and stable  Post vital signs: Reviewed and stable  Last Vitals:  Vitals Value Taken Time  BP 129/75 10/16/2018  4:03 PM  Temp    Pulse 72 10/16/2018  4:06 PM  Resp 15 10/16/2018  4:06 PM  SpO2 98 % 10/16/2018  4:06 PM  Vitals shown include unvalidated device data.  Last Pain:  Vitals:   10/16/18 0957  TempSrc: Oral  PainSc: 0-No pain         Complications: No apparent anesthesia complications

## 2018-10-16 NOTE — Interval H&P Note (Signed)
History and Physical Interval Note:  10/16/2018 12:10 PM  Jessica Campos  has presented today for surgery, with the diagnosis of Uterine Fibroids, Pelvic Pain, Dyspareunia  The various methods of treatment have been discussed with the patient and family. After consideration of risks, benefits and other options for treatment, the patient has consented to  Procedure(s): XI ROBOTIC ASSISTED TOTAL HYSTERECTOMY WITH SALPINGECTOMY (Bilateral) as a surgical intervention .  The patient's history has been reviewed, patient examined, no change in status, stable for surgery.  I have reviewed the patient's chart and labs.  Questions were answered to the patient's satisfaction.     Katharine Look A Cylie Dor

## 2018-10-16 NOTE — Anesthesia Procedure Notes (Signed)
Procedure Name: Intubation Date/Time: 10/16/2018 12:51 PM Performed by: Jonna Munro, CRNA Pre-anesthesia Checklist: Patient identified, Emergency Drugs available, Suction available, Patient being monitored and Timeout performed Patient Re-evaluated:Patient Re-evaluated prior to induction Oxygen Delivery Method: Circle system utilized Preoxygenation: Pre-oxygenation with 100% oxygen Induction Type: IV induction Ventilation: Mask ventilation without difficulty Laryngoscope Size: Mac and 3 Grade View: Grade I Tube type: Oral Tube size: 7.0 mm Number of attempts: 1 Airway Equipment and Method: Stylet Placement Confirmation: ETT inserted through vocal cords under direct vision,  positive ETCO2 and breath sounds checked- equal and bilateral Secured at: 22 cm Tube secured with: Tape Dental Injury: Teeth and Oropharynx as per pre-operative assessment

## 2018-10-16 NOTE — Op Note (Signed)
Preoperative diagnosis: Uterine fibroids  Postoperative diagnosis: Same with pelvic adhesions  Anesthesia: General  Anesthesiologist: Dr. Candida Peeling  Procedure: Robotically assisted total hysterectomy with bilateral salpingectomy  Surgeon: Dr. Katharine Look Amel Kitch  Assistant: Earnstine Regal P.A.-C.  Estimated blood loss: 50 cc  Procedure:  After being informed of the planned procedure with possible complications including but not limited to bleeding, infection, injury to other organs, need for laparotomy, possible need for morcellation with risks and benefits reviewed, expected hospital stay and recovery, informed consent is obtained and patient is taken to or #6. She is placed in  lithotomy position on Trengard with both arms padded and tucked on each side and bilateral knee-high sequential compressive devices. She is given general anesthesia with endotracheal intubation without any complication. She is prepped and draped in a sterile fashion. A three-way Foley catheter is inserted in her bladder.  Pelvic exam reveals: enlarged uterus at 16 weeks without pelvic mass  A weighted speculum is inserted in the vagina and the anterior lip of the cervix is grasped with a tenaculum forcep. We proceed with a paracervical block and vaginal infiltration using ropivacaine 0.5% diluted 1 in 1 with saline.The Mirena IUD is removed. The uterus was then sounded at 12 cm. We easily dilate the cervix using Hegar dilator to  #27 which allows for easy placement of the intrauterine RUMI manipulator with a 3.0 KOH ring and a vaginal occluder. The ring is sutured to the cervix with 0 Vicryl.  Trocar placement is decided. We infiltrate 5 cm above the umbilicus with 10 cc of ropivacaine per protocol and perform a 10 mm vertical incision which is brought down bluntly to the fascia. The fascia is identified and grasped with Coker forceps. The fascia is incised with Mayo scissors. Peritoneum is entered bluntly. A  pursestring suture of 0 Vicryl is placed on the fascia and a 10 mm Hassan trocar is easily inserted in the abdominal cavity held in placed with a Purstring suture. This allows for easy insufflation of a pneumoperitoneum using warmed CO2 at a maximum pressure of 15 mm of mercury. 60 cc of Ropivacaine 0.5 % diluted 1 in 1 is sent in the pelvis and the patient is positioned in reverse Trendelenburg. We then placed two 4mm robotic trocar on the left, one 33mm robotic trocar on the right and one 8 mm patient's side assistant trocar on the right  after infiltrating every site  with ropivacaine per protocol. The robot is docked on the left of the patient after positioning her in Trendelenburg. A monopolar scissor is inserted in arm #4, a Long bipolar forceps is inserted in arm #2 and a Prograsp is inserted in arm #1.  Preparation and docking is completed in 54 minutes.  Observation: The omentum is adherent to the abdominal wall. The uterus is enlarged with multiple fibroids. Both tubes and ovaries are normal.  We proceed with sharp dissection of the omental adhesions to access the pelvis.  Using VesselSeal, we proceed  on the right side by sealing and sectionning the mesosalpynx , the right utero-ovarian ligament and the right round ligament . This gives [place] entry into the retroperitoneal space with an easy dissection of the anterior broad ligament. This was opened all the way to the left round ligament.   We then proceed with systematic dissection of the bladder, which is densely adherent to the uterus,over way from the anterior vaginal cuff which is easily identified with the KOH ring. The plane of dissection is identified and  confirmed after filling the bladder with 200 cc of saline. We are able to dissect the bladder 2 cm below the KOH ring. We then opened the posterior right broad ligament all the way to the posterior KOH ring after identifying the full course of the right ureter.   Moving to the left side,  we seal and section the left round ligament , the left utero-ovarian ligament and  the mesosalpinx in between. Entry into the retroperitoneal space allows [place] to complete dissection of the bladder on the left side and skeletonized the all uterine vessels. The left broad ligament is then dissected all the way to the posterior KOH ring after identifying the full course of the left ureter.   With pressure on the KOH ring and the bladder fully dissected below we are able to cauterize the uterine vessels on both sides at the level of the KOH ring.  The vaginal occluder is inflated and we proceed with a 360 colpotomy using an open monopolar scissors and freeing the uterus entirely with its tubes.  The uterus is delivered vaginally with traction and manual morcellation. The vaginal occluder is reinserted in the vagina to maintain pneumoperitoneum.  Instruments are then modified for a suture cut in arm #4 and a long tip forcep in arm #2. We proceed with closure of the vaginal cuff with 2 running sutures of 0 V-Lock. We irrigated profusely with warm saline and confirm a satisfactory hemostasis as well as 2 normal ureters with good mobility and no dilatation.  All instruments are then removed and the robot is undocked. Console time: 1 hour and 30 minutes.  All trochars are removed under direct visualization after evacuating the pneumoperitoneum.  The fascia of the supraumbilical incision is closed with the previously placed pursestring suture of 0 Vicryl. All incisions are then closed with subcuticular suture of 3-0 Monocryl and Dermabond.  A speculum is inserted in the vagina to confirm a adequate closure of the vaginal cuff and good hemostasis.  Instrument and sponge count is complete x2. Estimated blood loss is 50 cc. The procedure is well tolerated by the patient is taken to recovery room in a well and stable condition.  Specimen: Uterus and tubes weighing 293 g sent to pathology

## 2018-10-17 DIAGNOSIS — D259 Leiomyoma of uterus, unspecified: Secondary | ICD-10-CM | POA: Diagnosis not present

## 2018-10-17 NOTE — Progress Notes (Signed)
Jessica Campos is a55 y.o.  [id number]  Post Op Date #1:  Robot Assisted Hysterectomy/BS/Lysis of Adhesions  Subjective: Patient is Doing well postoperatively. Patient has Pain is controlled with current analgesics. Medications being used: prescription NSAID's including Ibuprofen 600 mg and narcotic analgesics including Percocet 5/325 mg. Tolerating a regular diet, ambulating and voiding without difficulty. Reports some gas discomfort (attributed to the insufflation-not bowel gas).  Advised warm compresses over the area and time to resolve the symptom.   Objective: Vital signs in last 24 hours: Temp:  [97.3 F (36.3 C)-98.4 F (36.9 C)] 98.4 F (36.9 C) (12/13 0610) Pulse Rate:  [58-80] 76 (12/13 0610) Resp:  [12-18] 16 (12/13 0610) BP: (103-159)/(71-100) 124/82 (12/13 0610) SpO2:  [96 %-100 %] 100 % (12/13 0610)  Intake/Output from previous day: 12/12 0701 - 12/13 0700 In: 2120 [P.O.:120; I.V.:2000] Out: 2800 [Urine:2750] Intake/Output this shift: No intake/output data recorded. Recent Labs  Lab 10/13/18 1040  WBC 5.2  HGB 12.8  HCT 38.8  PLT 351     Recent Labs  Lab 10/13/18 1040  NA 140  K 4.3  CL 105  CO2 27  BUN 10  CREATININE 0.72  CALCIUM 9.2  GLUCOSE 68*    EXAM: General: alert, cooperative and no distress Resp: clear to auscultation bilaterally Cardio: regular rate and rhythm, S1, S2 normal, no murmur, click, rub or gallop GI: bowel sounds present, soft and incisions are intact with no evidence of infection.  Extremities: Homans sign is negative, no sign of DVT and no calf tenderness.   Assessment: s/p Procedure(s): XI ROBOTIC ASSISTED TOTAL HYSTERECTOMY WITH SALPINGECTOMY LYSIS OF ADHESIONS: stable, progressing well and not tolerating diet  Plan: Discharge home  LOS: 0 days    Earnstine Regal, PA-C 10/17/2018 8:15 AM

## 2023-07-18 ENCOUNTER — Encounter: Payer: Self-pay | Admitting: Internal Medicine

## 2023-07-18 ENCOUNTER — Ambulatory Visit: Payer: Self-pay | Admitting: Internal Medicine

## 2023-07-18 VITALS — BP 120/84 | HR 70 | Temp 98.3°F | Ht 63.0 in | Wt 169.4 lb

## 2023-07-18 DIAGNOSIS — E669 Obesity, unspecified: Secondary | ICD-10-CM | POA: Diagnosis not present

## 2023-07-18 DIAGNOSIS — Z23 Encounter for immunization: Secondary | ICD-10-CM

## 2023-07-18 NOTE — Progress Notes (Signed)
Established Patient Office Visit     CC/Reason for Visit: Establish care, discuss chronic conditions  HPI: Jessica Campos is a 42 y.o. female who is coming in today for the above mentioned reasons. Past Medical History is significant for: Obesity.  Has been feeling well without acute concerns.  She does not smoke, drinks alcohol occasionally, no allergies, family history significant for father who died at age 40 from a stroke he was diabetic, had hypertension and hyperlipidemia.  She is married and has 2 sons ages 45 and 17.  Requesting a flu vaccine.  Follows with GYN routinely for breast and cervical cancer screening.   Past Medical/Surgical History: Past Medical History:  Diagnosis Date   Ankle fracture 2005   Right   Elevated blood pressure reading    after second c section, resolved on its own, not currently on BP medications   Fibroids    Obesity (BMI 30-39.9)    PONV (postoperative nausea and vomiting)    Seasonal allergies     Past Surgical History:  Procedure Laterality Date   ANKLE SURGERY Right 2005   CESAREAN SECTION     x2   FOOT SURGERY Right 2013    Social History:  reports that she has never smoked. She has never used smokeless tobacco. She reports current alcohol use. She reports that she does not use drugs.  Allergies: No Known Allergies  Family History:  Family History  Problem Relation Age of Onset   Hypertension Mother    Hypertension Father    Diabetes type II Father      Current Outpatient Medications:    cetirizine (ZYRTEC) 10 MG tablet, Take 10 mg by mouth daily., Disp: , Rfl:    Multiple Vitamins-Minerals (MULTIVITAMIN ADULT PO), Take by mouth., Disp: , Rfl:    OVER THE COUNTER MEDICATION, Delta 8/5 mg once daily, Disp: , Rfl:    Probiotic Product (PROBIOTIC ADVANCED PO), Take by mouth., Disp: , Rfl:   Review of Systems:  Negative unless indicated in HPI.   Physical Exam: Vitals:   07/18/23 1503  BP: 120/84  Pulse: 70   Temp: 98.3 F (36.8 C)  TempSrc: Oral  SpO2: 99%  Weight: 169 lb 6.4 oz (76.8 kg)  Height: 5\' 3"  (1.6 m)    Body mass index is 30.01 kg/m.   Physical Exam Vitals reviewed.  Constitutional:      Appearance: Normal appearance.  HENT:     Head: Normocephalic and atraumatic.  Eyes:     Conjunctiva/sclera: Conjunctivae normal.     Pupils: Pupils are equal, round, and reactive to light.  Skin:    General: Skin is warm and dry.  Neurological:     General: No focal deficit present.     Mental Status: She is alert and oriented to person, place, and time.  Psychiatric:        Mood and Affect: Mood normal.        Behavior: Behavior normal.        Thought Content: Thought content normal.        Judgment: Judgment normal.      Impression and Plan:  Obesity (BMI 30-39.9)  Immunization due  -Discussed healthy lifestyle, including increased physical activity and better food choices to promote weight loss.  -She will schedule follow-up for annual preventive exam -Flu vaccine administered in office today.   Time spent:31 minutes reviewing chart, interviewing and examining patient and formulating plan of care.     Jessica Campos  Philip Aspen, MD [facility]

## 2023-09-04 ENCOUNTER — Encounter: Payer: Self-pay | Admitting: Internal Medicine

## 2023-09-04 ENCOUNTER — Ambulatory Visit (INDEPENDENT_AMBULATORY_CARE_PROVIDER_SITE_OTHER): Payer: BC Managed Care – PPO | Admitting: Internal Medicine

## 2023-09-04 VITALS — BP 120/80 | HR 75 | Temp 98.1°F | Ht 62.5 in | Wt 165.2 lb

## 2023-09-04 DIAGNOSIS — Z114 Encounter for screening for human immunodeficiency virus [HIV]: Secondary | ICD-10-CM | POA: Diagnosis not present

## 2023-09-04 DIAGNOSIS — Z1159 Encounter for screening for other viral diseases: Secondary | ICD-10-CM | POA: Diagnosis not present

## 2023-09-04 DIAGNOSIS — Z0000 Encounter for general adult medical examination without abnormal findings: Secondary | ICD-10-CM | POA: Diagnosis not present

## 2023-09-04 NOTE — Progress Notes (Signed)
Established Patient Office Visit     CC/Reason for Visit: Annual preventive exam  HPI: Jessica Campos is a 42 y.o. female who is coming in today for the above mentioned reasons.  No significant past medical history.  Feels well without acute concern or complaint.  Routine eye and dental care.  She follows routinely with GYN.   Past Medical/Surgical History: Past Medical History:  Diagnosis Date   Ankle fracture 2005   Right   Elevated blood pressure reading    after second c section, resolved on its own, not currently on BP medications   Fibroids    Obesity (BMI 30-39.9)    PONV (postoperative nausea and vomiting)    Seasonal allergies     Past Surgical History:  Procedure Laterality Date   ANKLE SURGERY Right 2005   CESAREAN SECTION     x2   FOOT SURGERY Right 2013    Social History:  reports that she has never smoked. She has never used smokeless tobacco. She reports current alcohol use. She reports that she does not use drugs.  Allergies: No Known Allergies  Family History:  Family History  Problem Relation Age of Onset   Hypertension Mother    Hypertension Father    Diabetes type II Father      Current Outpatient Medications:    cetirizine (ZYRTEC) 10 MG tablet, Take 10 mg by mouth daily., Disp: , Rfl:    Multiple Vitamins-Minerals (MULTIVITAMIN ADULT PO), Take by mouth., Disp: , Rfl:    OVER THE COUNTER MEDICATION, Delta 8/5 mg once daily, Disp: , Rfl:    Probiotic Product (PROBIOTIC ADVANCED PO), Take by mouth., Disp: , Rfl:   Review of Systems:  Negative unless indicated in HPI.   Physical Exam: Vitals:   09/04/23 1459  BP: 120/80  Pulse: 75  Temp: 98.1 F (36.7 C)  TempSrc: Oral  SpO2: 99%  Weight: 165 lb 3.2 oz (74.9 kg)  Height: 5' 2.5" (1.588 m)    Body mass index is 29.73 kg/m.   Physical Exam Vitals reviewed.  Constitutional:      General: She is not in acute distress.    Appearance: Normal appearance. She is not  ill-appearing, toxic-appearing or diaphoretic.  HENT:     Head: Normocephalic.     Right Ear: Tympanic membrane, ear canal and external ear normal. There is no impacted cerumen.     Left Ear: Tympanic membrane, ear canal and external ear normal. There is no impacted cerumen.     Nose: Nose normal.     Mouth/Throat:     Mouth: Mucous membranes are moist.     Pharynx: Oropharynx is clear. No oropharyngeal exudate or posterior oropharyngeal erythema.  Eyes:     General: No scleral icterus.       Right eye: No discharge.        Left eye: No discharge.     Conjunctiva/sclera: Conjunctivae normal.     Pupils: Pupils are equal, round, and reactive to light.  Neck:     Vascular: No carotid bruit.  Cardiovascular:     Rate and Rhythm: Normal rate and regular rhythm.     Pulses: Normal pulses.     Heart sounds: Normal heart sounds.  Pulmonary:     Effort: Pulmonary effort is normal. No respiratory distress.     Breath sounds: Normal breath sounds.  Abdominal:     General: Abdomen is flat. Bowel sounds are normal.     Palpations:  Abdomen is soft.  Musculoskeletal:        General: Normal range of motion.     Cervical back: Normal range of motion.  Skin:    General: Skin is warm and dry.  Neurological:     General: No focal deficit present.     Mental Status: She is alert and oriented to person, place, and time. Mental status is at baseline.  Psychiatric:        Mood and Affect: Mood normal.        Behavior: Behavior normal.        Thought Content: Thought content normal.        Judgment: Judgment normal.     Flowsheet Row Office Visit from 07/18/2023 in [facility] [place]  PHQ-9 Total Score 8        Impression and Plan:  Encounter for preventive health examination -     CBC with Differential/Platelet; Future -     Comprehensive metabolic panel; Future -     Lipid panel; Future -     TSH; Future -     Vitamin B12; Future -     VITAMIN D 25 Hydroxy  (Vit-D Deficiency, Fractures); Future  Encounter for hepatitis C screening test for low risk patient -     Hepatitis C antibody; Future  Encounter for screening for HIV -     HIV Antibody (routine testing w rflx); Future   -Recommend routine eye and dental care. -Healthy lifestyle discussed in detail. -Labs to be updated today. -Prostate cancer screening: N/A Health Maintenance  Topic Date Due   HIV Screening  Never done   Hepatitis C Screening  Never done   Pap with HPV screening  05/20/2015   DTaP/Tdap/Td vaccine (2 - Td or Tdap) 04/21/2025   Flu Shot  Completed   COVID-19 Vaccine  Completed   HPV Vaccine  Aged Out     -Immunizations are up-to-date. -Obtain records from GYN.     Chaya Jan, MD [facility]

## 2023-09-05 ENCOUNTER — Encounter: Payer: Self-pay | Admitting: Internal Medicine

## 2023-09-05 ENCOUNTER — Other Ambulatory Visit: Payer: Self-pay | Admitting: Internal Medicine

## 2023-09-05 DIAGNOSIS — E559 Vitamin D deficiency, unspecified: Secondary | ICD-10-CM

## 2023-09-05 DIAGNOSIS — E782 Mixed hyperlipidemia: Secondary | ICD-10-CM

## 2023-09-05 DIAGNOSIS — E785 Hyperlipidemia, unspecified: Secondary | ICD-10-CM | POA: Insufficient documentation

## 2023-09-05 LAB — LIPID PANEL
Cholesterol: 214 mg/dL — ABNORMAL HIGH (ref 0–200)
HDL: 71.4 mg/dL (ref >39.00)
LDL Cholesterol: 130 mg/dL — ABNORMAL HIGH (ref 0–99)
NonHDL: 142.83
Total CHOL/HDL Ratio: 3
Triglycerides: 66 mg/dL (ref 0.0–149.0)
VLDL: 13.2 mg/dL (ref 0.0–40.0)

## 2023-09-05 LAB — CBC WITH DIFFERENTIAL/PLATELET
Basophils Absolute: 0.1 10*3/uL (ref 0.0–0.1)
Basophils Relative: 1.2 % (ref 0.0–3.0)
Eosinophils Absolute: 0.2 10*3/uL (ref 0.0–0.7)
Eosinophils Relative: 3.4 % (ref 0.0–5.0)
HCT: 37.7 % (ref 36.0–46.0)
Hemoglobin: 12.4 g/dL (ref 12.0–15.0)
Lymphocytes Relative: 40.1 % (ref 12.0–46.0)
Lymphs Abs: 2.4 10*3/uL (ref 0.7–4.0)
MCHC: 32.8 g/dL (ref 30.0–36.0)
MCV: 91.2 fL (ref 78.0–100.0)
Monocytes Absolute: 0.5 10*3/uL (ref 0.1–1.0)
Monocytes Relative: 8.3 % (ref 3.0–12.0)
Neutro Abs: 2.8 10*3/uL (ref 1.4–7.7)
Neutrophils Relative %: 47 % (ref 43.0–77.0)
Platelets: 401 10*3/uL — ABNORMAL HIGH (ref 150.0–400.0)
RBC: 4.14 Mil/uL (ref 3.87–5.11)
RDW: 13.2 % (ref 11.5–15.5)
WBC: 5.9 10*3/uL (ref 4.0–10.5)

## 2023-09-05 LAB — COMPREHENSIVE METABOLIC PANEL
ALT: 12 U/L (ref 0–35)
AST: 18 U/L (ref 0–37)
Albumin: 4.6 g/dL (ref 3.5–5.2)
Alkaline Phosphatase: 30 U/L — ABNORMAL LOW (ref 39–117)
BUN: 9 mg/dL (ref 6–23)
CO2: 26 meq/L (ref 19–32)
Calcium: 9.1 mg/dL (ref 8.4–10.5)
Chloride: 103 meq/L (ref 96–112)
Creatinine, Ser: 0.74 mg/dL (ref 0.40–1.20)
GFR: 98.88 mL/min (ref >60.00)
Glucose, Bld: 73 mg/dL (ref 70–99)
Potassium: 3.8 meq/L (ref 3.5–5.1)
Sodium: 138 meq/L (ref 135–145)
Total Bilirubin: 0.6 mg/dL (ref 0.2–1.2)
Total Protein: 7 g/dL (ref 6.0–8.3)

## 2023-09-05 LAB — TSH: TSH: 0.92 u[IU]/mL (ref 0.35–5.50)

## 2023-09-05 LAB — VITAMIN B12: Vitamin B-12: 592 pg/mL (ref 211–911)

## 2023-09-05 LAB — HEPATITIS C ANTIBODY: Hepatitis C Ab: NONREACTIVE

## 2023-09-05 LAB — VITAMIN D 25 HYDROXY (VIT D DEFICIENCY, FRACTURES): VITD: 25.32 ng/mL — ABNORMAL LOW (ref 30.00–100.00)

## 2023-09-05 LAB — HIV ANTIBODY (ROUTINE TESTING W REFLEX): HIV 1&2 Ab, 4th Generation: NONREACTIVE

## 2023-09-05 MED ORDER — VITAMIN D (ERGOCALCIFEROL) 1.25 MG (50000 UNIT) PO CAPS: 50000.0000 [IU] | ORAL_CAPSULE | ORAL | 0 refills | Status: AC

## 2023-11-03 ENCOUNTER — Encounter: Payer: Self-pay | Admitting: Internal Medicine

## 2023-12-02 ENCOUNTER — Ambulatory Visit: Payer: BC Managed Care – PPO | Admitting: Internal Medicine

## 2023-12-02 ENCOUNTER — Other Ambulatory Visit (INDEPENDENT_AMBULATORY_CARE_PROVIDER_SITE_OTHER): Payer: Self-pay

## 2023-12-02 ENCOUNTER — Ambulatory Visit: Payer: Self-pay | Admitting: Internal Medicine

## 2023-12-02 DIAGNOSIS — E782 Mixed hyperlipidemia: Secondary | ICD-10-CM | POA: Diagnosis not present

## 2023-12-02 DIAGNOSIS — E559 Vitamin D deficiency, unspecified: Secondary | ICD-10-CM

## 2023-12-02 LAB — LIPID PANEL
Cholesterol: 242 mg/dL — ABNORMAL HIGH (ref 0–200)
HDL: 78.5 mg/dL (ref >39.00)
LDL Cholesterol: 143 mg/dL — ABNORMAL HIGH (ref 0–99)
NonHDL: 163.47
Total CHOL/HDL Ratio: 3
Triglycerides: 103 mg/dL (ref 0.0–149.0)
VLDL: 20.6 mg/dL (ref 0.0–40.0)

## 2023-12-02 LAB — VITAMIN D 25 HYDROXY (VIT D DEFICIENCY, FRACTURES): VITD: 40.51 ng/mL (ref 30.00–100.00)

## 2023-12-03 ENCOUNTER — Telehealth: Payer: Self-pay

## 2023-12-03 NOTE — Telephone Encounter (Signed)
Copied from CRM [id number]. Topic: Clinical - Lab/Test Results >> Dec 03, 2023  4:33 PM Rodman Pickle T wrote: Reason for CRM: patient is calling in regarding her lab results

## 2023-12-04 ENCOUNTER — Other Ambulatory Visit: Payer: Self-pay | Admitting: Internal Medicine

## 2023-12-04 ENCOUNTER — Encounter: Payer: Self-pay | Admitting: Internal Medicine

## 2023-12-04 DIAGNOSIS — E782 Mixed hyperlipidemia: Secondary | ICD-10-CM

## 2023-12-04 MED ORDER — ATORVASTATIN CALCIUM 40 MG PO TABS: 40.0000 mg | ORAL_TABLET | Freq: Every day | ORAL | 1 refills | Status: DC

## 2023-12-05 ENCOUNTER — Ambulatory Visit: Payer: BC Managed Care – PPO | Admitting: Internal Medicine

## 2023-12-19 ENCOUNTER — Other Ambulatory Visit: Payer: Self-pay | Admitting: Obstetrics and Gynecology

## 2023-12-19 DIAGNOSIS — Z1231 Encounter for screening mammogram for malignant neoplasm of breast: Secondary | ICD-10-CM

## 2024-01-14 ENCOUNTER — Encounter: Payer: Self-pay | Admitting: Family Medicine

## 2024-01-14 ENCOUNTER — Ambulatory Visit: Admitting: Family Medicine

## 2024-01-14 VITALS — BP 147/81 | HR 71 | Temp 98.6°F | Ht 62.0 in | Wt 167.0 lb

## 2024-01-14 DIAGNOSIS — E66811 Obesity, class 1: Secondary | ICD-10-CM | POA: Diagnosis not present

## 2024-01-14 DIAGNOSIS — E782 Mixed hyperlipidemia: Secondary | ICD-10-CM

## 2024-01-14 DIAGNOSIS — Z6830 Body mass index (BMI) 30.0-30.9, adult: Secondary | ICD-10-CM | POA: Diagnosis not present

## 2024-01-14 DIAGNOSIS — Z0289 Encounter for other administrative examinations: Secondary | ICD-10-CM

## 2024-01-14 DIAGNOSIS — E6609 Other obesity due to excess calories: Secondary | ICD-10-CM

## 2024-01-14 NOTE — Progress Notes (Signed)
 Office: [phone]  /  Fax: [phone]   Initial Visit  Jessica Campos was seen in clinic today to evaluate for obesity. She is interested in losing weight to improve overall health and reduce the risk of weight related complications. She presents today to review program treatment options, initial physical assessment, and evaluation.     She was referred by: Self-Referral  When asked what else they would like to accomplish? She states: Improve existing medical conditions  Weight history: weight rose to 178 lb with COVID and lost to 146 lb with exercise but is rising in the past 1.5 years.  When asked how has your weight affected you? She states: Contributed to medical problems  Some associated conditions: Hyperlipidemia  Contributing factors: Family history of obesity and Reduced physical activity  Weight promoting medications identified: None  Current nutrition plan: None  Current level of physical activity: Strength training 30-60 minutes and Other: Peloton bike  Current or previous pharmacotherapy: None  Response to medication: Never tried medications   Past medical history includes:   Past Medical History:  Diagnosis Date   Ankle fracture 2005   Right   Elevated blood pressure reading    after second c section, resolved on its own, not currently on BP medications   Fibroids    Obesity (BMI 30-39.9)    PONV (postoperative nausea and vomiting)    Seasonal allergies      Objective:   BP (!) 147/81   Pulse 71   Temp 98.6 F (37 C)   Ht 5\' 2"  (1.575 m)   Wt 167 lb (75.8 kg)   LMP 08/05/2018 Comment: Continuous bleeding since Oct.  SpO2 100%   BMI 30.54 kg/m  She was weighed on the bioimpedance scale: Body mass index is 30.54 kg/m.  Peak Weight:175 , Body Fat%:36.5, Visceral Fat Rating:8, Weight trend over the last 12 months: Increasing  General:  Alert, oriented and cooperative. Patient is in no acute distress.  Respiratory: Normal respiratory  effort, no problems with respiration noted   Gait: able to ambulate independently  Mental Status: Normal mood and affect. Normal behavior. Normal judgment and thought content.   DIAGNOSTIC DATA REVIEWED:  BMET    Component Value Date/Time   NA 138 09/04/2023 1530   K 3.8 09/04/2023 1530   CL 103 09/04/2023 1530   CO2 26 09/04/2023 1530   GLUCOSE 73 09/04/2023 1530   BUN 9 09/04/2023 1530   CREATININE 0.74 09/04/2023 1530   CALCIUM 9.1 09/04/2023 1530   GFRNONAA >60 10/13/2018 1040   GFRAA >60 10/13/2018 1040   No results found for: "HGBA1C" No results found for: "INSULIN" CBC    Component Value Date/Time   WBC 5.9 09/04/2023 1530   RBC 4.14 09/04/2023 1530   HGB 12.4 09/04/2023 1530   HCT 37.7 09/04/2023 1530   PLT 401.0 (H) 09/04/2023 1530   MCV 91.2 09/04/2023 1530   MCH 30.6 10/13/2018 1040   MCHC 32.8 09/04/2023 1530   RDW 13.2 09/04/2023 1530   Iron/TIBC/Ferritin/ %Sat No results found for: "IRON", "TIBC", "FERRITIN", "IRONPCTSAT" Lipid Panel     Component Value Date/Time   CHOL 242 (H) 12/02/2023 1319   TRIG 103.0 12/02/2023 1319   HDL 78.50 12/02/2023 1319   CHOLHDL 3 12/02/2023 1319   VLDL 20.6 12/02/2023 1319   LDLCALC 143 (H) 12/02/2023 1319   Hepatic Function Panel     Component Value Date/Time   PROT 7.0 09/04/2023 1530   ALBUMIN 4.6 09/04/2023 1530  AST 18 09/04/2023 1530   ALT 12 09/04/2023 1530   ALKPHOS 30 (L) 09/04/2023 1530   BILITOT 0.6 09/04/2023 1530      Component Value Date/Time   TSH 0.92 09/04/2023 1530     Assessment and Plan:   Mixed hyperlipidemia Lab Results  Component Value Date   CHOL 242 (H) 12/02/2023   HDL 78.50 12/02/2023   LDLCALC 143 (H) 12/02/2023   TRIG 103.0 12/02/2023   CHOLHDL 3 12/02/2023  She is doing well on atorvastatin 40 mg daily Tolerating well Concerned about fam hx of HLD and HTN  Look for lipid improvements with weight reduction  Class 1 obesity due to excess calories with serious  comorbidity and body mass index (BMI) of 30.0 to 30.9 in adult Reviewed weight history, program information and patient's readiness to change Set up fasting IC with new patient visit       Obesity Treatment / Action Plan:  Patient will work on garnering support from family and friends to begin weight loss journey. Will work on eliminating or reducing the presence of highly palatable, calorie dense foods in the home. Will complete provided nutritional and psychosocial assessment questionnaire before the next appointment. Will be scheduled for indirect calorimetry to determine resting energy expenditure in a fasting state.  This will allow [place] to create a reduced calorie, high-protein meal plan to promote loss of fat mass while preserving muscle mass. Will think about ideas on how to incorporate physical activity into their daily routine. Counseled on the health benefits of losing 5%-15% of total body weight. Was counseled on nutritional approaches to weight loss and benefits of reducing processed foods and consuming plant-based foods and high quality protein as part of nutritional weight management. Was counseled on pharmacotherapy and role as an adjunct in weight management.   Obesity Education Performed Today:  She was weighed on the bioimpedance scale and results were discussed and documented in the synopsis.  We discussed obesity as a disease and the importance of a more detailed evaluation of all the factors contributing to the disease.  We discussed the importance of long term lifestyle changes which include nutrition, exercise and behavioral modifications as well as the importance of customizing this to her specific health and social needs.  We discussed the benefits of reaching a healthier weight to alleviate the symptoms of existing conditions and reduce the risks of the biomechanical, metabolic and psychological effects of obesity.  Jessica Campos appears to be in the action  stage of change and states they are ready to start intensive lifestyle modifications and behavioral modifications.  20 minutes was spent today on this visit including the above counseling, pre-visit chart review, and post-visit documentation.  Reviewed by clinician on day of visit: allergies, medications, problem list, medical history, surgical history, family history, social history, and previous encounter notes pertinent to obesity diagnosis.    Seymour Bars, D.O. DABFM, [facility] [address] [place], [place] [address] [phone]

## 2024-01-20 ENCOUNTER — Encounter: Payer: Self-pay | Admitting: Family Medicine

## 2024-01-20 ENCOUNTER — Ambulatory Visit: Admitting: Family Medicine

## 2024-01-20 VITALS — BP 135/84 | HR 67 | Temp 98.1°F | Ht 62.0 in | Wt 163.0 lb

## 2024-01-20 DIAGNOSIS — Z1331 Encounter for screening for depression: Secondary | ICD-10-CM

## 2024-01-20 DIAGNOSIS — Z6829 Body mass index (BMI) 29.0-29.9, adult: Secondary | ICD-10-CM | POA: Diagnosis not present

## 2024-01-20 DIAGNOSIS — R5383 Other fatigue: Secondary | ICD-10-CM | POA: Diagnosis not present

## 2024-01-20 DIAGNOSIS — R0602 Shortness of breath: Secondary | ICD-10-CM

## 2024-01-20 DIAGNOSIS — R948 Abnormal results of function studies of other organs and systems: Secondary | ICD-10-CM

## 2024-01-20 DIAGNOSIS — E782 Mixed hyperlipidemia: Secondary | ICD-10-CM

## 2024-01-20 DIAGNOSIS — E663 Overweight: Secondary | ICD-10-CM

## 2024-01-20 DIAGNOSIS — E785 Hyperlipidemia, unspecified: Secondary | ICD-10-CM | POA: Diagnosis not present

## 2024-01-20 NOTE — Progress Notes (Signed)
 At a Glance:  Vitals Temp: 98.1 F (36.7 C) BP: 135/84 Pulse Rate: 67 SpO2: 100 %   Anthropometric Measurements Height: 5\' 2"  (1.575 m) Weight: 163 lb (73.9 kg) BMI (Calculated): 29.81 Starting Weight: 163lb Peak Weight: 175lb   Body Composition  Body Fat %: 34 % Fat Mass (lbs): 55.4 lbs Muscle Mass (lbs): 102.2 lbs Total Body Water (lbs): 71 lbs Visceral Fat Rating : 7   Other Clinical Data RMR: 1296 Fasting: Yes Labs: Yes Today's Visit #: 1 Starting Date: 01/20/24    EKG: Normal sinus rhythm, rate 67.  Indirect Calorimeter completed today shows a VO2 of 189 and a REE of 1296.  Her calculated basal metabolic rate is 1610 thus her basal metabolic rate is worse than expected.  Chief Complaint:  Obesity   Subjective:  Jessica Campos (MR# [id number]) is a 43 y.o. female who presents for evaluation and treatment of obesity and related comorbidities.   [name] is currently in the action stage of change and ready to dedicate time achieving and maintaining a healthier weight. [name] is interested in becoming our patient and working on intensive lifestyle modifications including (but not limited to) diet and exercise for weight loss.  [name] has been struggling with her weight. She has been unsuccessful in either losing weight, maintaining weight loss, or reaching her healthy weight goal.  [name]'s habits were reviewed today and are as follows: her desired weight loss is 30lb , she started gaining weight after poor diet, stress, sedentary job. she has significant food cravings issues, she snacks frequently in the evenings, she skips meals frequently, she is frequently drinking liquids with calories, she frequently makes poor food choices, she has problems with excessive hunger, and she struggles with emotional eating.   Other Fatigue [name] denies daytime somnolence and denies waking up still tired.  Braelee generally gets 8 hours of sleep per night, and states that she  has generally restful sleep. Snoring is not present. Apneic episodes are not present. Epworth Sleepiness Score is 6.   Shortness of Breath [name] notes increasing shortness of breath with exercising and seems to be worsening over time with weight gain. She notes getting out of breath sooner with activity than she used to. This has gotten worse recently. [name] denies shortness of breath at rest or orthopnea.   Depression Screen [name]'s Food and Mood (modified PHQ-9) score was 18.     09/04/2023    3:36 PM  Depression screen PHQ 2/9  Decreased Interest 1  Down, Depressed, Hopeless 1  PHQ - 2 Score 2  Altered sleeping 3  Tired, decreased energy 2  Change in appetite 0  Feeling bad or failure about yourself  0  Trouble concentrating 2  Moving slowly or fidgety/restless 0  Suicidal thoughts 0  PHQ-9 Score 9     Assessment and Plan:   Other Fatigue [name] does feel that her weight is causing her energy to be lower than it should be. Fatigue may be related to obesity, depression or many other causes. Labs will be ordered, and in the meanwhile, [name] will focus on self care including making healthy food choices, increasing physical activity and focusing on stress reduction.  Shortness of Breath [name] does feel that she gets out of breath more easily that she used to when she exercises. [name]'s shortness of breath appears to be obesity related and exercise induced. She has agreed to work on weight loss and gradually increase exercise to treat her exercise induced shortness  of breath. Will continue to monitor closely.  [name] had a positive depression screening. Depression is commonly associated with obesity and often results in emotional eating behaviors. We will monitor this closely and work on CBT to help improve the non-hunger eating patterns. Referral to Psychology may be required if no improvement is seen as she continues in our clinic.    Problem List Items Addressed This Visit      Hyperlipidemia Lab Results  Component Value Date   CHOL 242 (H) 12/02/2023   HDL 78.50 12/02/2023   LDLCALC 143 (H) 12/02/2023   TRIG 103.0 12/02/2023   CHOLHDL 3 12/02/2023   She is not taking RX lipid lowering medications.  She is taking an Omega 3 Fish Oil supplement and a Red Yeast Rice Extract daily.  Has + fam hx of HLD.  Begin prescribed dietary plan low in saturated fat and plan to recheck in 6 mos   Other Visit Diagnoses       SOBOE (shortness of breath on exertion)    -  Primary     Other fatigue       Relevant Orders   EKG 12-Lead (Completed)   Insulin, random   Hemoglobin A1c   Folate   Comprehensive metabolic panel         Low basal metabolic rate   Reviewed IC results with patient Drop in metabolic rate likely due to inadequate nutrient intake/ protein intake esp earlier in the day Begin Cat 3 meal plan as reviewed.  Continue both cardio and resistance training 5 days/ wk + 8 hrs of sleep at night        Overweight (BMI 25.0-29.9)         BMI 29.0-29.9,adult           [name] is currently in the action stage of change and her goal is to continue with weight loss efforts. I recommend [name] begin the structured treatment plan as follows:  She has agreed to Category 3 Plan  Exercise goals: All adults should avoid inactivity. Some activity is better than none, and adults who participate in any amount of physical activity, gain some health benefits.  Behavioral modification strategies:increasing lean protein intake, increase H2O intake, decrease liquid calories, increase high fiber foods, meal planning and cooking strategies, keeping healthy foods in the home, better snacking choices, avoiding temptations, planning for success, and decrease junk food   She was informed of the importance of frequent follow-up visits to maximize her success with intensive lifestyle modifications for her multiple health conditions. She was informed we would discuss her lab results at her  next visit unless there is a critical issue that needs to be addressed sooner. [name] agreed to keep her next visit at the agreed upon time to discuss these results.  Objective:  General: Cooperative, alert, well developed, in no acute distress. HEENT: Conjunctivae and lids unremarkable. Cardiovascular: Regular rhythm.  Lungs: Normal work of breathing. Neurologic: No focal deficits.   Lab Results  Component Value Date   CREATININE 0.74 09/04/2023   BUN 9 09/04/2023   NA 138 09/04/2023   K 3.8 09/04/2023   CL 103 09/04/2023   CO2 26 09/04/2023   Lab Results  Component Value Date   ALT 12 09/04/2023   AST 18 09/04/2023   ALKPHOS 30 (L) 09/04/2023   BILITOT 0.6 09/04/2023   No results found for: "HGBA1C" No results found for: "INSULIN" Lab Results  Component Value Date   TSH 0.92 09/04/2023  Lab Results  Component Value Date   CHOL 242 (H) 12/02/2023   HDL 78.50 12/02/2023   LDLCALC 143 (H) 12/02/2023   TRIG 103.0 12/02/2023   CHOLHDL 3 12/02/2023   Lab Results  Component Value Date   WBC 5.9 09/04/2023   HGB 12.4 09/04/2023   HCT 37.7 09/04/2023   MCV 91.2 09/04/2023   PLT 401.0 (H) 09/04/2023   No results found for: "IRON", "TIBC", "FERRITIN"  Attestation Statements:  Reviewed by clinician on day of visit: allergies, medications, problem list, medical history, surgical history, family history, social history, and previous encounter notes.  Time spent on visit including pre-visit chart review and post-visit charting and care was 43 minutes.   Glennis Brink, DO

## 2024-01-21 LAB — COMPREHENSIVE METABOLIC PANEL
ALT: 9 IU/L (ref 0–32)
AST: 16 IU/L (ref 0–40)
Albumin: 4.7 g/dL (ref 3.9–4.9)
Alkaline Phosphatase: 40 IU/L — ABNORMAL LOW (ref 44–121)
BUN/Creatinine Ratio: 15 (ref 9–23)
BUN: 10 mg/dL (ref 6–24)
Bilirubin Total: 0.5 mg/dL (ref 0.0–1.2)
CO2: 21 mmol/L (ref 20–29)
Calcium: 9 mg/dL (ref 8.7–10.2)
Chloride: 103 mmol/L (ref 96–106)
Creatinine, Ser: 0.67 mg/dL (ref 0.57–1.00)
Globulin, Total: 2.1 g/dL (ref 1.5–4.5)
Glucose: 75 mg/dL (ref 70–99)
Potassium: 4.3 mmol/L (ref 3.5–5.2)
Sodium: 139 mmol/L (ref 134–144)
Total Protein: 6.8 g/dL (ref 6.0–8.5)
eGFR: 110 mL/min/{1.73_m2} (ref >59)

## 2024-01-21 LAB — HEMOGLOBIN A1C
Est. average glucose Bld gHb Est-mCnc: 100 mg/dL
Hgb A1c MFr Bld: 5.1 % (ref 4.8–5.6)

## 2024-01-21 LAB — FOLATE: Folate: 9 ng/mL (ref >3.0)

## 2024-01-21 LAB — INSULIN, RANDOM: INSULIN: 6.9 u[IU]/mL (ref 2.6–24.9)

## 2024-01-30 ENCOUNTER — Ambulatory Visit

## 2024-02-03 ENCOUNTER — Ambulatory Visit: Admitting: Family Medicine

## 2024-02-03 ENCOUNTER — Encounter: Payer: Self-pay | Admitting: Family Medicine

## 2024-02-03 VITALS — BP 109/74 | HR 68 | Temp 98.2°F | Ht 62.0 in | Wt 160.0 lb

## 2024-02-03 DIAGNOSIS — E663 Overweight: Secondary | ICD-10-CM

## 2024-02-03 DIAGNOSIS — Z6829 Body mass index (BMI) 29.0-29.9, adult: Secondary | ICD-10-CM | POA: Diagnosis not present

## 2024-02-03 DIAGNOSIS — E785 Hyperlipidemia, unspecified: Secondary | ICD-10-CM

## 2024-02-03 NOTE — Progress Notes (Signed)
 Office: [phone]  /  Fax: [phone]  WEIGHT SUMMARY AND BIOMETRICS  Starting Date: 01/20/24  Starting Weight: 163lb   Weight Lost Since Last Visit: 3lb   Vitals Temp: 98.2 F (36.8 C) BP: 109/74 Pulse Rate: 68 SpO2: 100 %   Body Composition  Body Fat %: 33.6 % Fat Mass (lbs): 54 lbs Muscle Mass (lbs): 101.4 lbs Total Body Water (lbs): 69.8 lbs Visceral Fat Rating : 7   HPI  Chief Complaint: OBESITY  Jessica Campos is here to discuss her progress with her obesity treatment plan. She is on the the Category 3 Plan and states she is following her eating plan approximately 90 % of the time. She states she is exercising 30-60 minutes 5 times per week.   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 Body mass index is 29.26 kg/m.  General: She is healthy appearing,  cooperative, alert, well developed, and in no acute distress. PSYCH: Has normal mood, affect and thought process.   Lungs: Normal breathing effort, no conversational dyspnea.   ASSESSMENT AND PLAN  TREATMENT PLAN FOR OBESITY:  Recommended Dietary Goals  [name] is currently in the action stage of change. As such, her goal is to continue weight management plan. She has agreed to the Category 3 Plan.  Behavioral Intervention  We discussed the following Behavioral Modification Strategies today: increasing lean protein intake to established goals, increasing lower glycemic fruits,  increasing fiber rich foods, work on meal planning and preparation, keeping healthy foods at home, work on managing stress, creating time for self-care and relaxation, avoiding temptations and identifying enticing environmental cues, continue to practice mindfulness when eating, and continue to work on maintaining a reduced calorie state, getting the recommended amount of protein, incorporating whole foods, making healthy choices, staying well hydrated and practicing mindfulness when eating.. She can add one additional fresh or frozen fruit daily She can reduce meat at dinner to 6-8 oz adding one carb serving to dinner Keep snacks to 1-2 per day  Additional resources provided today: NA  Recommended Physical Activity Goals  Becky has been advised to work up to 150 minutes of moderate intensity aerobic activity a week and strengthening exercises 2-3 times per week for cardiovascular health, weight loss maintenance and preservation of muscle mass.   She has agreed to Increase the intensity, frequency or duration of strengthening exercises  and Increase the intensity, frequency or duration of aerobic exercises   Aim for 7,000-10,000 steps/ day + weight training 2 x a week  Pharmacotherapy changes for the treatment of obesity: none  ASSOCIATED CONDITIONS ADDRESSED TODAY  Hyperlipidemia, unspecified hyperlipidemia type FLP was reviewed from 1/27 done by PCP non fasting She has + fam hx of heart dz/ stroke -- dad at age 59 She is not on lipid lowering meds She is working on a low sugar/ low saturated fat diet, weight loss and regular exercise  Recommend repeat FLP in 5-6  mos Assessment & Plan: The 10-year ASCVD risk score (Arnett DK, et al., 2019) is: 0.3%   Values used to calculate the score:     Age: 43 years     Sex: Female     Is Non-Hispanic African American: Yes     Diabetic: No     Tobacco smoker: No     Systolic Blood Pressure: 109 mmHg     Is BP treated: No     HDL Cholesterol:  78.5 mg/dL     Total Cholesterol: 242 mg/dL    Overweight (BMI 16.1-09.9) Continue prescribed diet with modifications reviewed on AVS Counseled on healthy pace of weight loss, importance of maintaining lean muscle mass  BMI 29.0-29.9,adult      She was informed of the importance of frequent follow up visits to maximize her success with intensive lifestyle modifications for her multiple health conditions.   ATTESTASTION STATEMENTS:  Reviewed by clinician on day of visit: allergies, medications, problem list, medical history, surgical history, family history, social history, and previous encounter notes pertinent to obesity diagnosis.   I have personally spent 30 minutes total time today in preparation, patient care, nutritional counseling and documentation for this visit, including the following: review of clinical lab tests; review of medical tests/procedures/services.      Glennis Brink, DO DABFM, DABOM [facility] [address] [place], [place] [address] [phone]

## 2024-02-03 NOTE — Patient Instructions (Addendum)
 Changes to food plan: Can reduce lean protein at dinner to 6-8 oz adding a starch serving with dinner: One serving of rice OR potato OR beans OR corn OR Barilla Protein pasta OR Keto Buns OR low carb tortilla  Expand fruit to servings per day (fresh of frozen) - any  At lunch, you have the option to have a salad + 4 oz of lean protein with light dressing (measure) (You can move one slice of bread to breakfast if needed)  Add a Multivitamin daily   On days that you do a Peloton Ride, aim for > 7,000 steps/ day On non Peloton ride days, aim for 10,000 steps/ day Continue weight training 2 days/ wk   You should be getting in > 90 oz / day of water

## 2024-02-03 NOTE — Assessment & Plan Note (Signed)
 The 10-year ASCVD risk score (Arnett DK, et al., 2019) is: 0.3%   Values used to calculate the score:     Age: 43 years     Sex: Female     Is Non-Hispanic African American: Yes     Diabetic: No     Tobacco smoker: No     Systolic Blood Pressure: 109 mmHg     Is BP treated: No     HDL Cholesterol: 78.5 mg/dL     Total Cholesterol: 242 mg/dL

## 2024-02-13 ENCOUNTER — Ambulatory Visit: Admitting: Internal Medicine

## 2024-02-17 ENCOUNTER — Ambulatory Visit
Admission: RE | Admit: 2024-02-17 | Discharge: 2024-02-17 | Disposition: A | Discharge status: Home / Self Care | Source: Ambulatory Visit | Attending: Obstetrics and Gynecology | Admitting: Obstetrics and Gynecology

## 2024-02-17 DIAGNOSIS — Z1231 Encounter for screening mammogram for malignant neoplasm of breast: Secondary | ICD-10-CM

## 2024-02-21 ENCOUNTER — Ambulatory Visit

## 2024-02-21 ENCOUNTER — Ambulatory Visit
Admission: RE | Admit: 2024-02-21 | Discharge: 2024-02-21 | Disposition: A | Discharge status: Home / Self Care | Source: Ambulatory Visit | Attending: Obstetrics and Gynecology | Admitting: Obstetrics and Gynecology

## 2024-02-21 ENCOUNTER — Other Ambulatory Visit: Payer: Self-pay | Admitting: Obstetrics and Gynecology

## 2024-02-21 ENCOUNTER — Encounter: Payer: Self-pay | Admitting: Obstetrics and Gynecology

## 2024-02-21 DIAGNOSIS — R928 Other abnormal and inconclusive findings on diagnostic imaging of breast: Secondary | ICD-10-CM

## 2024-02-21 DIAGNOSIS — N6001 Solitary cyst of right breast: Secondary | ICD-10-CM

## 2024-02-21 DIAGNOSIS — N6310 Unspecified lump in the right breast, unspecified quadrant: Secondary | ICD-10-CM

## 2024-02-25 ENCOUNTER — Other Ambulatory Visit

## 2024-02-25 ENCOUNTER — Other Ambulatory Visit: Payer: Self-pay | Admitting: Obstetrics and Gynecology

## 2024-02-25 ENCOUNTER — Ambulatory Visit
Admission: RE | Admit: 2024-02-25 | Discharge: 2024-02-25 | Disposition: A | Discharge status: Home / Self Care | Source: Ambulatory Visit | Attending: Obstetrics and Gynecology | Admitting: Obstetrics and Gynecology

## 2024-02-25 DIAGNOSIS — N6001 Solitary cyst of right breast: Secondary | ICD-10-CM

## 2024-02-25 DIAGNOSIS — N6310 Unspecified lump in the right breast, unspecified quadrant: Secondary | ICD-10-CM

## 2024-02-25 DIAGNOSIS — R928 Other abnormal and inconclusive findings on diagnostic imaging of breast: Secondary | ICD-10-CM

## 2024-02-25 HISTORY — PX: BREAST BIOPSY: SHX20

## 2024-02-26 ENCOUNTER — Encounter: Payer: Self-pay | Admitting: Family Medicine

## 2024-02-26 ENCOUNTER — Ambulatory Visit: Admitting: Family Medicine

## 2024-02-26 VITALS — BP 122/83 | HR 67 | Temp 98.9°F | Ht 62.0 in | Wt 159.0 lb

## 2024-02-26 DIAGNOSIS — R948 Abnormal results of function studies of other organs and systems: Secondary | ICD-10-CM

## 2024-02-26 DIAGNOSIS — E663 Overweight: Secondary | ICD-10-CM | POA: Diagnosis not present

## 2024-02-26 DIAGNOSIS — F419 Anxiety disorder, unspecified: Secondary | ICD-10-CM

## 2024-02-26 DIAGNOSIS — Z6829 Body mass index (BMI) 29.0-29.9, adult: Secondary | ICD-10-CM

## 2024-02-26 LAB — SURGICAL PATHOLOGY

## 2024-02-26 NOTE — Progress Notes (Signed)
 Office: [phone]  /  Fax: [phone]  WEIGHT SUMMARY AND BIOMETRICS  Starting Date: 01/20/24  Starting Weight: 163lb   Weight Lost Since Last Visit: 1lb   Vitals Temp: 98.9 F (37.2 C) BP: 122/83 Pulse Rate: 67 SpO2: 100 %   Body Composition  Body Fat %: 32.4 % Fat Mass (lbs): 51.6 lbs Muscle Mass (lbs): 102 lbs Total Body Water  (lbs): 71.2 lbs Visceral Fat Rating : 6   HPI  Chief Complaint: OBESITY  Jessica Campos is here to discuss her progress with her obesity treatment plan. She is on the the Category 3 Plan and states she is following her eating plan approximately 75 % of the time. She states she is exercising peleton bike and strength training 30 minutes 5 times per week.  Interval History:  Since last office visit she is down 1 lb This gives her a net weight loss of 4 lb in 1 month of medically supervised weight management She has been tracking daily steps, increased to 7,000 per day She has traveled for Spring Break and has eaten out more often She has been anxious while undergoing a workup for new diagnosis of R DCIS Has appt with [facility] next week  Pharmacotherapy: none  PHYSICAL EXAM:  Blood pressure 122/83, pulse 67, temperature 98.9 F (37.2 C), height 5\' 2"  (1.575 m), weight 159 lb (72.1 kg), last menstrual period 08/05/2018, SpO2 100%. Body mass index is 29.08 kg/m.  General: She is healthy appearing,  cooperative, alert, well developed, and in no acute distress. PSYCH: Has normal mood, affect and thought process.   Lungs: Normal breathing effort, no conversational dyspnea.   ASSESSMENT AND PLAN  TREATMENT PLAN FOR OBESITY:  Recommended Dietary Goals  Savreen is currently in the action stage of change. As such, her goal is to continue weight management plan. She has agreed to the Category 3 Plan. Additional cat 3 lunch option handouts given Homeade seasoning handout given Cut out highly processed lunch meat, microwave  meals and prepackaged bread  Behavioral Intervention  We discussed the following Behavioral Modification Strategies today: increasing lean protein intake to established goals, increasing fiber rich foods, increasing water  intake , work on meal planning and preparation, keeping healthy foods at home, work on managing stress, creating time for self-care and relaxation, and continue to work on maintaining a reduced calorie state, getting the recommended amount of protein, incorporating whole foods, making healthy choices, staying well hydrated and practicing mindfulness when eating..  Additional resources provided today: NA  Recommended Physical Activity Goals  Denni has been advised to work up to 150 minutes of moderate intensity aerobic activity a week and strengthening exercises 2-3 times per week for cardiovascular health, weight loss maintenance and preservation of muscle mass.   She has agreed to Think about enjoyable ways to increase daily physical activity and overcoming barriers to exercise and Increase physical activity in their day and reduce sedentary time (increase NEAT).  Pharmacotherapy changes for the treatment of obesity: none  ASSOCIATED CONDITIONS ADDRESSED TODAY  Low basal metabolic rate She has improved eating on a schedule, intake of lean protein with meals, increasing walking time but sleep has been limited due to anxious mood  Continue to work on getting in ~1500 cal/ day to include lean protein and fiber with meals Great job increasing walking time  Overweight (BMI 25.0-29.9)  BMI 29.0-29.9,adult  Anxious mood New with recent dx of DCIS, pending upcoming visit with [facility] next week.  She has been doing some emotional eating and not sleeping well.  Husband is supportive.    Counseled patient that these are normal reaction.  Lean on friends and family for support.  Focus on self care, good nutrition, regular exercise to keep body healthy during  treatment plan.  Will move f/u out to 2 mos     She was informed of the importance of frequent follow up visits to maximize her success with intensive lifestyle modifications for her multiple health conditions.   ATTESTASTION STATEMENTS:  Reviewed by clinician on day of visit: allergies, medications, problem list, medical history, surgical history, family history, social history, and previous encounter notes pertinent to obesity diagnosis.   I have personally spent 22 minutes total time today in preparation, patient care, nutritional counseling and education,  and documentation for this visit, including the following: review of most recent clinical lab tests,  reviewing medical assistant documentation, review and interpretation of bioimpedence results.     Micky Albee, D.O. DABFM, DABOM [facility] [address] [place], [place] [address] [phone]

## 2024-02-27 ENCOUNTER — Telehealth: Payer: Self-pay | Admitting: *Deleted

## 2024-02-27 NOTE — Telephone Encounter (Signed)
 Spoke to patient to confirm upcoming morning [facility] clinic appointment on 4/30, paperwork will be sent via e-mail  Gave location and time, also informed patient that the surgeon's office would be calling as well to get information from them similar to the packet that they will be receiving so make sure to do both.  Reminded patient that all providers will be coming to the clinic to see them HERE and if they had any questions to not hesitate to reach back out to myself or their navigators.

## 2024-02-27 NOTE — Telephone Encounter (Signed)
 LVM to patient in reference to upcoming appointment, left my contact to call back for [facility] on 4/30

## 2024-03-02 ENCOUNTER — Encounter: Payer: Self-pay | Admitting: *Deleted

## 2024-03-02 DIAGNOSIS — D0511 Intraductal carcinoma in situ of right breast: Secondary | ICD-10-CM | POA: Insufficient documentation

## 2024-03-03 ENCOUNTER — Other Ambulatory Visit

## 2024-03-03 ENCOUNTER — Encounter

## 2024-03-04 ENCOUNTER — Ambulatory Visit: Admitting: Physical Therapy

## 2024-03-04 ENCOUNTER — Inpatient Hospital Stay: Admitting: Licensed Clinical Social Worker

## 2024-03-04 ENCOUNTER — Inpatient Hospital Stay

## 2024-03-04 ENCOUNTER — Inpatient Hospital Stay: Admitting: Genetic Counselor

## 2024-03-04 ENCOUNTER — Inpatient Hospital Stay: Attending: Hematology and Oncology | Admitting: Hematology and Oncology

## 2024-03-04 ENCOUNTER — Ambulatory Visit
Admission: RE | Admit: 2024-03-04 | Discharge: 2024-03-04 | Disposition: A | Discharge status: Home / Self Care | Source: Ambulatory Visit | Attending: Radiation Oncology | Admitting: Radiation Oncology

## 2024-03-04 ENCOUNTER — Encounter: Payer: Self-pay | Admitting: Genetic Counselor

## 2024-03-04 ENCOUNTER — Encounter: Payer: Self-pay | Admitting: Hematology and Oncology

## 2024-03-04 ENCOUNTER — Ambulatory Visit: Payer: Self-pay | Admitting: Surgery

## 2024-03-04 VITALS — BP 140/81 | HR 80 | Temp 98.2°F | Resp 16 | Ht 62.01 in | Wt 160.2 lb

## 2024-03-04 DIAGNOSIS — Z170 Estrogen receptor positive status [ER+]: Secondary | ICD-10-CM | POA: Diagnosis not present

## 2024-03-04 DIAGNOSIS — Z8042 Family history of malignant neoplasm of prostate: Secondary | ICD-10-CM | POA: Insufficient documentation

## 2024-03-04 DIAGNOSIS — Z803 Family history of malignant neoplasm of breast: Secondary | ICD-10-CM | POA: Insufficient documentation

## 2024-03-04 DIAGNOSIS — Z1721 Progesterone receptor positive status: Secondary | ICD-10-CM | POA: Insufficient documentation

## 2024-03-04 DIAGNOSIS — D0511 Intraductal carcinoma in situ of right breast: Secondary | ICD-10-CM

## 2024-03-04 LAB — CMP (CANCER CENTER ONLY)
ALT: 9 U/L (ref 0–44)
AST: 16 U/L (ref 15–41)
Albumin: 5.1 g/dL — ABNORMAL HIGH (ref 3.5–5.0)
Alkaline Phosphatase: 31 U/L — ABNORMAL LOW (ref 38–126)
Anion gap: 8 (ref 5–15)
BUN: 14 mg/dL (ref 6–20)
CO2: 26 mmol/L (ref 22–32)
Calcium: 9.5 mg/dL (ref 8.9–10.3)
Chloride: 103 mmol/L (ref 98–111)
Creatinine: 0.81 mg/dL (ref 0.44–1.00)
GFR, Estimated: >60 mL/min (ref >60)
Glucose, Bld: 88 mg/dL (ref 70–99)
Potassium: 3.7 mmol/L (ref 3.5–5.1)
Sodium: 137 mmol/L (ref 135–145)
Total Bilirubin: 0.6 mg/dL (ref 0.0–1.2)
Total Protein: 7.9 g/dL (ref 6.5–8.1)

## 2024-03-04 LAB — CBC WITH DIFFERENTIAL (CANCER CENTER ONLY)
Abs Immature Granulocytes: 0.01 10*3/uL (ref 0.00–0.07)
Basophils Absolute: 0 10*3/uL (ref 0.0–0.1)
Basophils Relative: 0 %
Eosinophils Absolute: 0.1 10*3/uL (ref 0.0–0.5)
Eosinophils Relative: 2 %
HCT: 39.4 % (ref 36.0–46.0)
Hemoglobin: 13.6 g/dL (ref 12.0–15.0)
Immature Granulocytes: 0 %
Lymphocytes Relative: 33 %
Lymphs Abs: 1.9 10*3/uL (ref 0.7–4.0)
MCH: 30.4 pg (ref 26.0–34.0)
MCHC: 34.5 g/dL (ref 30.0–36.0)
MCV: 88.1 fL (ref 80.0–100.0)
Monocytes Absolute: 0.5 10*3/uL (ref 0.1–1.0)
Monocytes Relative: 8 %
Neutro Abs: 3.4 10*3/uL (ref 1.7–7.7)
Neutrophils Relative %: 57 %
Platelet Count: 364 10*3/uL (ref 150–400)
RBC: 4.47 MIL/uL (ref 3.87–5.11)
RDW: 12.2 % (ref 11.5–15.5)
WBC Count: 5.9 10*3/uL (ref 4.0–10.5)
nRBC: 0 % (ref 0.0–0.2)

## 2024-03-04 LAB — GENETIC SCREENING ORDER

## 2024-03-04 NOTE — Progress Notes (Signed)
 Radiation Oncology         [phone] ________________________________  Multidisciplinary Breast Oncology Clinic [facility]) Initial Outpatient Consultation  Name: Jessica Campos MRN: [id number]  Date: 03/04/2024  DOB: [birth year]  YQ:[id number] Fran Imus, MD  Sim Dryer, MD   REFERRING PHYSICIAN: Sim Dryer, MD  DIAGNOSIS: The encounter diagnosis was Ductal carcinoma in situ (DCIS) of right breast.  Stage 0 (cTis (DCIS), cN0, cM0) Right Breast, Intermediate grade DCIS, ER+ / PR+ / Her2 not assessed    ICD-10-CM   1. Ductal carcinoma in situ (DCIS) of right breast  D05.11       HISTORY OF PRESENT ILLNESS::Jessica Campos is a 43 y.o. female who is presenting to the office today for evaluation of her newly diagnosed breast cancer. She is accompanied by her husband. She is doing well overall.   She had routine screening mammography on 02/17/24 showing possible abnormalities in both breasts. She underwent bilateral diagnostic mammography with tomography and right breast ultrasonography at [facility] on 02/21/24 showing: an incidental 6 mm  mass in the 10 o'clock right breast demonstrated sonographically, an additional incidental benign cyst in the 9 o'clock right breast measuring 4 mm. No mammographic evidence of malignancy was demonstrated in the left breast.   Biopsy of the 10 o'clock right breast mass on 02/25/24 showed: intermediate grade DCIS measuring 4 mm in the greatest linear extent of the sample without evidence of necrosis and calcifications. Prognostic indicators significant for: estrogen receptor, 99% positive with strong staining intensity and progesterone receptor, 90% positive with moderate-strong staining intensity. Her2 not assessed.   Menarche: 43 years old Age at first live birth: 43 years old GP: 2 LMP: last period in October of 2019 Contraceptive: yes - BC pill from 2000-2008 and again from 2008-2011; Mirena IUD from 2011-2019 HRT: never  used   The patient was referred today for presentation in the multidisciplinary conference.  Radiology studies and pathology slides were presented there for review and discussion of treatment options.  A consensus was discussed regarding potential next steps.  PREVIOUS RADIATION THERAPY: No  PAST MEDICAL HISTORY:  Past Medical History:  Diagnosis Date   Ankle fracture 2005   Right   Anxiety    Breast cancer (HCC)    Elevated blood pressure reading    after second c section, resolved on its own, not currently on BP medications   Fibroids    High cholesterol    Joint pain    Obesity (BMI 30-39.9)    PONV (postoperative nausea and vomiting)    Seasonal allergies    Vitamin D  deficiency     PAST SURGICAL HISTORY: Past Surgical History:  Procedure Laterality Date   ANKLE SURGERY Right 2005   BREAST BIOPSY Right 02/25/2024   US  RT BREAST BX W LOC DEV 1ST LESION IMG BX SPEC US  GUIDE 02/25/2024 [facility] MAMMOGRAPHY   CESAREAN SECTION     x2   FOOT SURGERY Right 2013   PARTIAL HYSTERECTOMY      FAMILY HISTORY:  Family History  Problem Relation Age of Onset   Hyperlipidemia Mother    Hypertension Mother    Obesity Mother    Obesity Father    Stroke Father    Hyperlipidemia Father    Diabetes Father    Hypertension Father    Diabetes type II Father    Alcoholism Father    Breast cancer Maternal Aunt    Prostate cancer Maternal Uncle    Breast cancer Paternal Aunt  Prostate cancer Paternal Uncle     SOCIAL HISTORY:  Social History   Socioeconomic History   Marital status: Married    Spouse name: Not on file   Number of children: Not on file   Years of education: Not on file   Highest education level: Not on file  Occupational History   Not on file  Tobacco Use   Smoking status: Never   Smokeless tobacco: Never  Vaping Use   Vaping status: Never Used  Substance and Sexual Activity   Alcohol use: Yes    Comment: occasional   Drug use: Never   Sexual  activity: Yes    Birth control/protection: I.U.D.    Comment: mirena 10/2010  Other Topics Concern   Not on file  Social History Narrative   Not on file   Social Drivers of Health   Financial Resource Strain: Not on file  Food Insecurity: Not on file  Transportation Needs: Not on file  Physical Activity: Not on file  Stress: Not on file  Social Connections: Unknown (06/16/2023)   Received [facility]   Social Network    Social Network: Not on file    ALLERGIES: Not on File  MEDICATIONS:  Current Outpatient Medications  Medication Sig Dispense Refill   Bacillus Coagulans-Inulin (PROBIOTIC) 1-250 BILLION-MG CAPS Take by mouth.     Boric Acid 600 MG SUPP      cetirizine (ZYRTEC) 10 MG tablet Take 10 mg by mouth daily.     Ibuprofen -diphenhydrAMINE Cit (ADVIL  PM) 200-38 MG TABS Take by mouth.     Omega-3 Fatty Acids (FISH OIL) 1000 MG CAPS      Red Yeast Rice Extract 600 MG CAPS      No current facility-administered medications for this encounter.    REVIEW OF SYSTEMS: A 10+ POINT REVIEW OF SYSTEMS WAS OBTAINED including neurology, dermatology, psychiatry, cardiac, respiratory, lymph, extremities, GI, GU, musculoskeletal, constitutional, reproductive, HEENT. On the provided form, she reports night sweats, loss of sleep, wearing glasses, sleeping with 2 pillows, anxiety, and hot flashes. She denies any other symptoms.    PHYSICAL EXAM:     03/04/2024  Vitals with BMI   Height 5' 2.008"   Weight 160 lbs 3 oz   BMI 29.29   Systolic 140 !   Diastolic 81 !   Pulse 80       Lungs are clear to auscultation bilaterally. Heart has regular rate and rhythm. No palpable cervical, supraclavicular, or axillary adenopathy. Abdomen soft, non-tender, normal bowel sounds. Breast: Left breast with no palpable mass, nipple discharge, or bleeding. Right breast with bruising in the upper outer quadrant with biopsy induration present.   KPS = 100  100 - Normal; no complaints; no  evidence of disease. 90   - Able to carry on normal activity; minor signs or symptoms of disease. 80   - Normal activity with effort; some signs or symptoms of disease. 88   - Cares for self; unable to carry on normal activity or to do active work. 60   - Requires occasional assistance, but is able to care for most of his personal needs. 50   - Requires considerable assistance and frequent medical care. 40   - Disabled; requires special care and assistance. 30   - Severely disabled; hospital admission is indicated although death not imminent. 20   - Very sick; hospital admission necessary; active supportive treatment necessary. 10   - Moribund; fatal processes progressing rapidly. 0     -  Dead  Karnofsky DA, Abelmann WH, Craver LS and Burchenal [facility] [phone]) The use of the nitrogen mustards in the palliative treatment of carcinoma: with particular reference to bronchogenic carcinoma Cancer 1 634-56  LABORATORY DATA:  Lab Results  Component Value Date   WBC 5.9 09/04/2023   HGB 12.4 09/04/2023   HCT 37.7 09/04/2023   MCV 91.2 09/04/2023   PLT 401.0 (H) 09/04/2023   Lab Results  Component Value Date   NA 139 01/20/2024   K 4.3 01/20/2024   CL 103 01/20/2024   CO2 21 01/20/2024   Lab Results  Component Value Date   ALT 9 01/20/2024   AST 16 01/20/2024   ALKPHOS 40 (L) 01/20/2024   BILITOT 0.5 01/20/2024    PULMONARY FUNCTION TEST:   Review Flowsheet        No data to display          RADIOGRAPHY: US  RT BREAST BX W LOC DEV 1ST LESION IMG BX SPEC US  GUIDE Addendum Date: 02/27/2024 ADDENDUM REPORT: 02/27/2024 09:22 ADDENDUM: Pathology revealed DUCTAL CARCINOMA IN SITU, INTERMEDIATE NUCLEAR GRADE, APOCRINE FEATURES, NECROSIS: NOT IDENTIFIED, CALCIFICATIONS: NOT IDENTIFIED of the RIGHT breast, 10 o'clock mass, (ribbon clip). This was found to be concordant by Dr. Allena Ito. Pathology results were discussed with the patient by telephone. The patient reported doing well  after the biopsy with tenderness at the site. Post biopsy instructions and care were reviewed and questions were answered. The patient was encouraged to call [facility] for any additional concerns. My direct phone number was provided. The patient was referred to [facility] on March 04, 2024. Consideration for a bilateral breast MRI given age and heterogeneously dense breast tissue-C, which may obscure small masses. Pathology results reported by Kraig Peru, RN on 02/26/2024. Electronically Signed   By: Allena Ito M.D.   On: 02/27/2024 09:22   Result Date: 02/27/2024 CLINICAL DATA:  43 year old female presenting for biopsy of a mass in the right breast at 10 o'clock. EXAM: ULTRASOUND GUIDED RIGHT BREAST CORE NEEDLE BIOPSY COMPARISON:  Previous exam(s). PROCEDURE: I met with the patient and we discussed the procedure of ultrasound-guided biopsy, including benefits and alternatives. We discussed the high likelihood of a successful procedure. We discussed the risks of the procedure, including infection, bleeding, tissue injury, clip migration, and inadequate sampling. Informed written consent was given. The usual time-out protocol was performed immediately prior to the procedure. Lesion quadrant: Upper outer quadrant Using sterile technique and 1% Lidocaine  as local anesthetic, under direct ultrasound visualization, a 14 gauge spring-loaded device was used to perform biopsy of a mass in the right breast at 10 o'clock using a lateral approach. At the conclusion of the procedure a ribbon shaped tissue marker clip was deployed into the biopsy cavity. Follow up 2 view mammogram was performed and dictated separately. Patient has a moderate post biopsy hematoma. Bleeding was completely stopped prior to the patient leaving the office and patient was given instructions for home care. IMPRESSION: Ultrasound guided  biopsy of a mass in the right breast at 10 o'clock. No apparent complications. Electronically Signed: By: Allena Ito M.D. On: 02/25/2024 14:49   MM CLIP PLACEMENT RIGHT Result Date: 02/25/2024 CLINICAL DATA:  Post procedure mammogram for clip placement EXAM: 3D DIAGNOSTIC RIGHT MAMMOGRAM POST ULTRASOUND BIOPSY COMPARISON:  Previous exam(s). ACR Breast Density Category c: The breasts are heterogeneously dense, which may obscure small masses. FINDINGS: 3D Mammographic  images were obtained following ultrasound guided biopsy of a mass in the right breast at 10 o'clock. The ribbon biopsy marking clip is in expected position at the site of biopsy. IMPRESSION: Appropriate positioning of the ribbon shaped biopsy marking clip at the site of biopsy in the right breast at 10 o'clock. Final Assessment: Post Procedure Mammograms for Marker Placement Electronically Signed   By: Allena Ito M.D.   On: 02/25/2024 14:50   MM 3D DIAGNOSTIC MAMMOGRAM BILATERAL BREAST Result Date: 02/21/2024 CLINICAL DATA:  Bilateral callback EXAM: DIGITAL DIAGNOSTIC BILATERAL MAMMOGRAM WITH TOMOSYNTHESIS AND CAD; ULTRASOUND RIGHT BREAST LIMITED TECHNIQUE: Bilateral digital diagnostic mammography and breast tomosynthesis was performed. The images were evaluated with computer-aided detection. ; Targeted ultrasound examination of the right breast was performed COMPARISON:  Previous exam(s). ACR Breast Density Category c: The breasts are heterogeneously dense, which may obscure small masses. FINDINGS: The previously described findings do not persist with additional views, consistent with superimposed fibroglandular tissue. Fibroglandular tissue assumes a configuration stable in comparison to prior mammogram. No suspicious mass, microcalcification, or other finding is identified. Targeted ultrasound was performed of the RIGHT outer breast. No suspicious cystic or solid seen at the site of mammographic concern. There is incidental  sonographic note of a benign cyst at 9 o'clock 4 cm from the nipple which measures 4 x 3 x 3 mm. There is incidental sonographic note of an oval circumscribed hypoechoic mass with suggestion of posterior acoustic enhancement at 10 o'clock 2 cm from the nipple. This measures 5 x 3 x 6 mm. IMPRESSION: 1. Incidental sonographically identified 6 mm probably benign mass in the RIGHT breast at 10 o'clock. Option for short-term follow-up with ultrasound in 6 months versus definitive characterization with ultrasound-guided aspiration with potential conversion to biopsy was discussed with patient. Patient would prefer to proceed with definitive characterization at this point in time. As such, recommend RIGHT breast ultrasound guided aspiration with potential conversion to biopsy. 2. No mammographic evidence of malignancy in the LEFT breast. RECOMMENDATION: RIGHT breast ultrasound-guided aspiration with potential conversion to biopsy x1 I have discussed the findings and recommendations with the patient. The aspiration with potential conversion to biopsy procedure was discussed with the patient and questions were answered. Patient expressed their understanding of the recommendation. Patient will be scheduled for procedure at her earliest convenience by the schedulers. Ordering provider will be notified. If applicable, a reminder letter will be sent to the patient regarding the next appointment. BI-RADS CATEGORY  3: Probably benign. Electronically Signed   By: Clancy Crimes M.D.   On: 02/21/2024 15:23   US  LIMITED ULTRASOUND INCLUDING AXILLA RIGHT BREAST Result Date: 02/21/2024 CLINICAL DATA:  Bilateral callback EXAM: DIGITAL DIAGNOSTIC BILATERAL MAMMOGRAM WITH TOMOSYNTHESIS AND CAD; ULTRASOUND RIGHT BREAST LIMITED TECHNIQUE: Bilateral digital diagnostic mammography and breast tomosynthesis was performed. The images were evaluated with computer-aided detection. ; Targeted ultrasound examination of the right breast was  performed COMPARISON:  Previous exam(s). ACR Breast Density Category c: The breasts are heterogeneously dense, which may obscure small masses. FINDINGS: The previously described findings do not persist with additional views, consistent with superimposed fibroglandular tissue. Fibroglandular tissue assumes a configuration stable in comparison to prior mammogram. No suspicious mass, microcalcification, or other finding is identified. Targeted ultrasound was performed of the RIGHT outer breast. No suspicious cystic or solid seen at the site of mammographic concern. There is incidental sonographic note of a benign cyst at 9 o'clock 4 cm from the nipple which measures 4 x 3 x 3 mm.  There is incidental sonographic note of an oval circumscribed hypoechoic mass with suggestion of posterior acoustic enhancement at 10 o'clock 2 cm from the nipple. This measures 5 x 3 x 6 mm. IMPRESSION: 1. Incidental sonographically identified 6 mm probably benign mass in the RIGHT breast at 10 o'clock. Option for short-term follow-up with ultrasound in 6 months versus definitive characterization with ultrasound-guided aspiration with potential conversion to biopsy was discussed with patient. Patient would prefer to proceed with definitive characterization at this point in time. As such, recommend RIGHT breast ultrasound guided aspiration with potential conversion to biopsy. 2. No mammographic evidence of malignancy in the LEFT breast. RECOMMENDATION: RIGHT breast ultrasound-guided aspiration with potential conversion to biopsy x1 I have discussed the findings and recommendations with the patient. The aspiration with potential conversion to biopsy procedure was discussed with the patient and questions were answered. Patient expressed their understanding of the recommendation. Patient will be scheduled for procedure at her earliest convenience by the schedulers. Ordering provider will be notified. If applicable, a reminder letter will be sent  to the patient regarding the next appointment. BI-RADS CATEGORY  3: Probably benign. Electronically Signed   By: Clancy Crimes M.D.   On: 02/21/2024 15:23   MM 3D SCREENING MAMMOGRAM BILATERAL BREAST Result Date: 02/20/2024 CLINICAL DATA:  Screening. EXAM: DIGITAL SCREENING BILATERAL MAMMOGRAM WITH TOMOSYNTHESIS AND CAD TECHNIQUE: Bilateral screening digital craniocaudal and mediolateral oblique mammograms were obtained. Bilateral screening digital breast tomosynthesis was performed. The images were evaluated with computer-aided detection. COMPARISON:  None. ACR Breast Density Category c: The breasts are heterogeneously dense, which may obscure small masses. FINDINGS: In the right breast a possible mass requires further evaluation. In the left breast a possible asymmetry requires further evaluation. IMPRESSION: Further evaluation is suggested for possible mass in the right breast. Further evaluation is suggested for possible asymmetry in the left breast. RECOMMENDATION: Diagnostic mammogram and possibly ultrasound of both breasts. (Code:[id number]) The patient will be contacted regarding the findings, and additional imaging will be scheduled. BI-RADS CATEGORY  0: Incomplete: Need additional imaging evaluation. Electronically Signed   By: Roda Cirri M.D.   On: 02/20/2024 10:01      IMPRESSION: Stage 0 (cTis (DCIS), cN0, cM0) Right Breast, Intermediate grade DCIS, ER+ / PR+ / Her2 not assessed  Patient will be a good candidate for breast conservation with radiotherapy to the right breast. We discussed the general course of radiation, potential side effects, and toxicities with radiation and the patient is interested in this approach.   Patient was inquiring whether a mastectomy would be an option and she was informed that a mastectomy would not be necessarily required. We discussed that a potential advantage of a mastectomy is avoiding radiation therapy. She would be a candidate for immediate  reconstructive surgery if she chooses that route. She is also considering a prophylactic left breast mastectomy.   Addendum: After meeting with Dr. Afton Horse she has opted to proceed with bilateral mastectomies.   PLAN:  Genetics  Bilateral mastectomies with bilateral reconstructive surgery (referred to Dr. Marieta Shorten)    ------------------------------------------------  Noralee Beam, PhD, MD  This document serves as a record of services personally performed by Retta Caster, MD. It was created on his behalf by Aleta Anda, a trained medical scribe. The creation of this record is based on the scribe's personal observations and the provider's statements to them. This document has been checked and approved by the attending provider.

## 2024-03-04 NOTE — Progress Notes (Signed)
 [facility] CONSULT NOTE  Patient Care Team: Zilphia Hilt, Charyl Coppersmith, MD as PCP - General (Internal Medicine) Alane Hsu, RN as Oncology Nurse Navigator Auther Bo, RN as Oncology Nurse Navigator Sim Dryer, MD as Consulting Physician (General Surgery) Murleen Arms, MD as Consulting Physician (Hematology and Oncology) Retta Caster, MD as Consulting Physician (Radiation Oncology)  CHIEF COMPLAINTS/PURPOSE OF CONSULTATION:  Newly diagnosed breast cancer  HISTORY OF PRESENTING ILLNESS:  Jessica Campos 43 y.o. female is here because of recent diagnosis of right breast IDC  I reviewed her records extensively and collaborated the history with the patient.  SUMMARY OF ONCOLOGIC HISTORY: Oncology History  Ductal carcinoma in situ (DCIS) of right breast  03/02/2024 Initial Diagnosis   Ductal carcinoma in situ (DCIS) of right breast   03/04/2024 Cancer Staging   Staging form: Breast, AJCC 8th Edition - Clinical stage from 03/04/2024: Stage 0 (cTis (DCIS), cN0, cM0, ER+, PR+) - Signed by Murleen Arms, MD on 03/04/2024 Stage prefix: Initial diagnosis Nuclear grade: G2    Discussed the use of AI scribe software for clinical note transcription with the patient, who gave verbal consent to proceed.  History of Present Illness Jessica Campos is a 43 year old female with stage 0 breast cancer (DCIS) who presents for consultation regarding bilateral mastectomy.  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 She experienced a hematoma following a recent breast biopsy due to hitting a vessel during the procedure.  Her past medical history includes a partial hysterectomy due to prolonged menstrual periods, described as 'three weeks on, one week off' for several years. She has two children, both delivered via C-section, and breastfed each for about a year. She has a history of using birth control for  several years and no longer uses the Mirena IUD.  Her family history includes breast cancer on her father's side (his sister) and her mother's side (her mother's aunt), as well as prostate cancer.  She uses delta-9 THC for anxiety at night and has previously used collagen supplements and pre-workout drinks. She occasionally consumes alcohol, about two to three drinks per week, and is considering lifestyle changes such as reducing red meat intake and increasing exercise.  No significant menopausal symptoms reported.  MEDICAL HISTORY:  Past Medical History:  Diagnosis Date   Ankle fracture 2005   Right   Anxiety    Breast cancer (HCC)    Elevated blood pressure reading    after second c section, resolved on its own, not currently on BP medications   Fibroids    High cholesterol    Joint pain    Obesity (BMI 30-39.9)    PONV (postoperative nausea and vomiting)    Seasonal allergies    Vitamin D  deficiency     SURGICAL HISTORY: Past Surgical History:  Procedure Laterality Date   ANKLE SURGERY Right 2005   BREAST BIOPSY Right 02/25/2024   US  RT BREAST BX W LOC DEV 1ST LESION IMG BX SPEC US  GUIDE 02/25/2024 [facility] MAMMOGRAPHY   CESAREAN SECTION     x2   FOOT SURGERY Right 2013   PARTIAL HYSTERECTOMY      SOCIAL HISTORY: Social History   Socioeconomic History   Marital status: Married    Spouse name: Not on file   Number of children: Not on file   Years of education: Not on file   Highest education level: Not on file  Occupational History   Not  on file  Tobacco Use   Smoking status: Never   Smokeless tobacco: Never  Vaping Use   Vaping status: Never Used  Substance and Sexual Activity   Alcohol use: Yes    Comment: occasional   Drug use: Never   Sexual activity: Yes    Birth control/protection: I.U.D.    Comment: mirena 10/2010  Other Topics Concern   Not on file  Social History Narrative   Not on file   Social Drivers of Health   Financial Resource Strain:  Not on file  Food Insecurity: No Food Insecurity (03/04/2024)   Hunger Vital Sign    Worried About Running Out of Food in the Last Year: Never true    Ran Out of Food in the Last Year: Never true  Transportation Needs: No Transportation Needs (03/04/2024)   PRAPARE - Administrator, Civil Service (Medical): No    Lack of Transportation (Non-Medical): No  Physical Activity: Not on file  Stress: Not on file  Social Connections: Unknown (06/16/2023)   Received [facility]   Social Network    Social Network: Not on file  Intimate Partner Violence: Not At Risk (03/04/2024)   Humiliation, Afraid, Rape, and Kick questionnaire    Fear of Current or Ex-Partner: No    Emotionally Abused: No    Physically Abused: No    Sexually Abused: No    FAMILY HISTORY: Family History  Problem Relation Age of Onset   Hyperlipidemia Mother    Hypertension Mother    Obesity Mother    Obesity Father    Stroke Father    Hyperlipidemia Father    Diabetes Father    Hypertension Father    Diabetes type II Father    Alcoholism Father    Breast cancer Maternal Aunt    Prostate cancer Maternal Uncle    Breast cancer Paternal Aunt    Prostate cancer Paternal Uncle     ALLERGIES:  has no allergies on file.  MEDICATIONS:  Current Outpatient Medications  Medication Sig Dispense Refill   Bacillus Coagulans-Inulin (PROBIOTIC) 1-250 BILLION-MG CAPS Take by mouth.     Boric Acid 600 MG SUPP      cetirizine (ZYRTEC) 10 MG tablet Take 10 mg by mouth daily.     Omega-3 Fatty Acids (FISH OIL) 1000 MG CAPS      Red Yeast Rice Extract 600 MG CAPS      Ibuprofen -diphenhydrAMINE Cit (ADVIL  PM) 200-38 MG TABS Take by mouth.     No current facility-administered medications for this visit.    REVIEW OF SYSTEMS:   Constitutional: Denies fevers, chills or abnormal night sweats Eyes: Denies blurriness of vision, double vision or watery eyes Ears, nose, mouth, throat, and face: Denies mucositis or  sore throat Respiratory: Denies cough, dyspnea or wheezes Cardiovascular: Denies palpitation, chest discomfort or lower extremity swelling Gastrointestinal:  Denies nausea, heartburn or change in bowel habits Skin: Denies abnormal skin rashes Lymphatics: Denies new lymphadenopathy or easy bruising Neurological:Denies numbness, tingling or new weaknesses Behavioral/Psych: Mood is stable, no new changes  Breast: Denies any palpable lumps or discharge All other systems were reviewed with the patient and are negative.  PHYSICAL EXAMINATION: ECOG PERFORMANCE STATUS: 0 - Asymptomatic  Vitals:   03/04/24 0848  BP: (!) 140/81  Pulse: 80  Resp: 16  Temp: 98.2 F (36.8 C)  SpO2: 100%   Filed Weights   03/04/24 0848  Weight: 160 lb 3.2 oz (72.7 kg)    GENERAL:alert,  no distress and comfortable NECK: supple, thyroid  normal size, non-tender, without nodularity BREAST:No palpable nodules in breast. Post biopsy changes in the right breast. No palpable axillary or supraclavicular lymphadenopathy   LABORATORY DATA:  I have reviewed the data as listed Lab Results  Component Value Date   WBC 5.9 03/04/2024   HGB 13.6 03/04/2024   HCT 39.4 03/04/2024   MCV 88.1 03/04/2024   PLT 364 03/04/2024   Lab Results  Component Value Date   NA 137 03/04/2024   K 3.7 03/04/2024   CL 103 03/04/2024   CO2 26 03/04/2024    RADIOGRAPHIC STUDIES: I have personally reviewed the radiological reports and agreed with the findings in the report.  ASSESSMENT AND PLAN:  Ductal carcinoma in situ (DCIS) of right breast Assessment and Plan Assessment & Plan Ductal carcinoma in situ of breast  Pathology review: I discussed with the patient the difference between DCIS and invasive breast cancer. It is considered a precancerous lesion. DCIS is classified as a Stage 0 breast cancer. It is generally detected through mammograms as calcifications. We discussed the significance of grades and its impact on  prognosis. We also discussed the importance of ER and PR receptors and their implications to adjuvant treatment options. Prognosis of DCIS dependence on grade and degree of comedo necrosis. It is anticipated that if not treated, 20-30% of DCIS can develop into invasive breast cancer.  DCIS, stage 0, ER/PR positive. Potential for upstaging to invasive cancer upon surgical evaluation.  Discussed role of tamoxifen adjuvant in case invasive breast cancer is diagnosed.  Emphasized lifestyle modifications and genetic testing due to family history. Pt prefers to proceed with bilateral mastectomy. Reviewed mechanism of action of tamoxifen, adverse effects of tamoxifen including but not limited to post menopausal symptoms, risk of DVT/PE, endometrial hyperplasia and endometrial carcinoma Encourage regular exercise (150 minutes per week) and maintaining a healthy body weight. Advise dietary modifications: reduce red meat intake, increase lean meat and plant-based meals. FU with me after surgery to review pathology and any additional recommendations.    All questions were answered. The patient knows to call the clinic with any problems, questions or concerns.    Murleen Arms, MD 03/04/24

## 2024-03-04 NOTE — Progress Notes (Signed)
 [facility] Clinical Social Work  Initial Assessment   Jessica Campos is a 43 y.o. year old female accompanied by husband, Drew. Clinical Social Work was referred by  [facility]  for assessment of psychosocial needs.   SDOH (Social Determinants of Health) assessments performed: Yes SDOH Interventions    Flowsheet Row Clinical Support from 03/04/2024 in [facility] - A Dept Of [place]  SDOH Interventions   Food Insecurity Interventions Intervention Not Indicated  Housing Interventions Intervention Not Indicated  Transportation Interventions Intervention Not Indicated  Utilities Interventions Intervention Not Indicated       SDOH Screenings   Food Insecurity: No Food Insecurity (03/04/2024)  Housing: Low Risk  (03/04/2024)  Transportation Needs: No Transportation Needs (03/04/2024)  Utilities: Not At Risk (03/04/2024)  Depression (PHQ2-9): Medium Risk (09/04/2023)  Social Connections: Unknown (06/16/2023)   Received from [facility]  Tobacco Use: Low Risk  (03/04/2024)   Received from [facility]     Distress Screen completed: No     No data to display            Family/Social Information:  Housing Arrangement: patient lives with husband and two sons ([place]- 40, Bishop Bullock- 90) Family members/support persons in your life? Family, Friends, Warehouse manager, and Journalist, newspaper concerns: no  Employment: Working full time for [employer] (10 month).  Income source: Employment & husband's employment Financial concerns: No Type of concern: None Food access concerns: no Religious or spiritual practice: Vallie Gay is important to pt and helps her cope Advanced directives: No- discussed today Services Currently in place:  [employer] [facility]  Coping/ Adjustment to diagnosis: Patient understands treatment plan and what happens next? yes, feeling a little better after meeting with the medical team and learning more. Pt does have a history  of anxiety that increased over the last 3-4 years. She previously had counseling but now manages with faith, exercise, and sleep aids Concerns about diagnosis and/or treatment: Overwhelmed by information Patient reported stressors: Children and Adjusting to my illness. Particularly how her 43yo will handle the information Patient enjoys exercise and time with family/ friends Current coping skills/ strengths: Ability for insight , Capable of independent living , Communication skills , Motivation for treatment/growth , Religious Affiliation , and Supportive family/friends     SUMMARY: Current SDOH Barriers:  No major barriers identified today  Clinical Social Work Clinical Goal(s):  No clinical social work goals at this time  Interventions: Discussed common feeling and emotions when being diagnosed with cancer, and the importance of support during treatment Informed patient of the support team roles and support services at [facility] Provided CSW contact information and encouraged patient to call with any questions or concerns Provided patient with information about support for kids Discussed financial assistance foundations and how to identify OOP max   Follow Up Plan: Patient will contact CSW with any support or resource needs Patient verbalizes understanding of plan: Yes    Avanish Cerullo E Jairo Bellew, LCSW Clinical Social Worker [facility]

## 2024-03-04 NOTE — Assessment & Plan Note (Addendum)
 Assessment and Plan Assessment & Plan Ductal carcinoma in situ of breast  Pathology review: I discussed with the patient the difference between DCIS and invasive breast cancer. It is considered a precancerous lesion. DCIS is classified as a Stage 0 breast cancer. It is generally detected through mammograms as calcifications. We discussed the significance of grades and its impact on prognosis. We also discussed the importance of ER and PR receptors and their implications to adjuvant treatment options. Prognosis of DCIS dependence on grade and degree of comedo necrosis. It is anticipated that if not treated, 20-30% of DCIS can develop into invasive breast cancer.  DCIS, stage 0, ER/PR positive. Potential for upstaging to invasive cancer upon surgical evaluation.  Discussed role of tamoxifen adjuvant in case invasive breast cancer is diagnosed.  Emphasized lifestyle modifications and genetic testing due to family history. Pt prefers to proceed with bilateral mastectomy. Reviewed mechanism of action of tamoxifen, adverse effects of tamoxifen including but not limited to post menopausal symptoms, risk of DVT/PE, endometrial hyperplasia and endometrial carcinoma Encourage regular exercise (150 minutes per week) and maintaining a healthy body weight. Advise dietary modifications: reduce red meat intake, increase lean meat and plant-based meals. FU with me after surgery to review pathology and any additional recommendations.

## 2024-03-04 NOTE — Progress Notes (Signed)
 REFERRING PROVIDER: Murleen Arms, MD  PRIMARY PROVIDER:  Zilphia Hilt, Charyl Coppersmith, MD  PRIMARY REASON FOR VISIT:  1. Ductal carcinoma in situ (DCIS) of right breast   2. Family history of breast cancer   3. Family history of prostate cancer    HISTORY OF PRESENT ILLNESS:   Ms. Jessica Campos, a 43 y.o. female, was seen for a [place] cancer genetics consultation at the request of Dr. Arno Bibles due to a personal and family history of cancer.  Jessica Campos presents to clinic today to discuss the possibility of a hereditary predisposition to cancer, to discuss genetic testing, and to further clarify her future cancer risks, as well as potential cancer risks for family members.   In April 2025, at the age of 59, Jessica Campos was diagnosed with ductal carcinoma in situ of the right breast (ER/PR positive).  CANCER HISTORY:  Oncology History  Ductal carcinoma in situ (DCIS) of right breast  03/02/2024 Initial Diagnosis   Ductal carcinoma in situ (DCIS) of right breast   03/04/2024 Cancer Staging   Staging form: Breast, AJCC 8th Edition - Clinical stage from 03/04/2024: Stage 0 (cTis (DCIS), cN0, cM0, ER+, PR+) - Signed by Murleen Arms, MD on 03/04/2024 Stage prefix: Initial diagnosis Nuclear grade: G2    Past Medical History:  Diagnosis Date   Ankle fracture 2005   Right   Anxiety    Breast cancer (HCC)    Elevated blood pressure reading    after second c section, resolved on its own, not currently on BP medications   Fibroids    High cholesterol    Joint pain    Obesity (BMI 30-39.9)    PONV (postoperative nausea and vomiting)    Seasonal allergies    Vitamin D  deficiency     Past Surgical History:  Procedure Laterality Date   ANKLE SURGERY Right 2005   BREAST BIOPSY Right 02/25/2024   US  RT BREAST BX W LOC DEV 1ST LESION IMG BX SPEC US  GUIDE 02/25/2024 [facility] MAMMOGRAPHY   CESAREAN SECTION     x2   FOOT SURGERY Right 2013   PARTIAL HYSTERECTOMY      Social History    Socioeconomic History   Marital status: Married    Spouse name: Not on file   Number of children: Not on file   Years of education: Not on file   Highest education level: Not on file  Occupational History   Not on file  Tobacco Use   Smoking status: Never   Smokeless tobacco: Never  Vaping Use   Vaping status: Never Used  Substance and Sexual Activity   Alcohol use: Yes    Comment: occasional   Drug use: Never   Sexual activity: Yes    Birth control/protection: I.U.D.    Comment: mirena 10/2010  Other Topics Concern   Not on file  Social History Narrative   Not on file   Social Drivers of Health   Financial Resource Strain: Not on file  Food Insecurity: No Food Insecurity (03/04/2024)   Hunger Vital Sign    Worried About Running Out of Food in the Last Year: Never true    Ran Out of Food in the Last Year: Never true  Transportation Needs: No Transportation Needs (03/04/2024)   PRAPARE - Administrator, Civil Service (Medical): No    Lack of Transportation (Non-Medical): No  Physical Activity: Not on file  Stress: Not on file  Social Connections: Unknown (06/16/2023)   Received  from [facility]   Social Network    Social Network: Not on file     FAMILY HISTORY:  We obtained a detailed, 4-generation family history.  Significant diagnoses are listed below: Family History  Problem Relation Age of Onset   Hyperlipidemia Mother    Hypertension Mother    Obesity Mother    Obesity Father    Stroke Father    Hyperlipidemia Father    Diabetes Father    Hypertension Father    Diabetes type II Father    Alcoholism Father    Prostate cancer Maternal Uncle 23   Prostate cancer Maternal Uncle 29       metastatic   Breast cancer Paternal Aunt 50       recurred at 42   Breast cancer Other 30       maternal great aunt     Ms. Rooke is unaware of previous family history of genetic testing for hereditary cancer risks. There is no reported Ashkenazi  Jewish ancestry.   GENETIC COUNSELING ASSESSMENT: Jessica Campos is a 43 y.o. female with a personal and family history of cancer which is somewhat suggestive of a hereditary predisposition to cancer given her young age at diagnosis. We, therefore, discussed and recommended the following at today's visit.   DISCUSSION: We discussed that 5 - 10% of cancer is hereditary, with most cases of breast cancer associated with BRCA1/2.  There are other genes that can be associated with hereditary breast cancer syndromes.  We discussed that testing is beneficial for several reasons including knowing how to follow individuals after completing their treatment, identifying whether potential treatment options would be beneficial, and understanding if other family members could be at risk for cancer and allowing them to undergo genetic testing.   We reviewed the characteristics, features and inheritance patterns of hereditary cancer syndromes. We also discussed genetic testing, including the appropriate family members to test, the process of testing, insurance coverage and turn-around-time for results. We discussed the implications of a negative, positive, carrier and/or variant of uncertain significant result. We recommended Jessica Campos pursue genetic testing for a panel that includes genes associated with breast cancer.   Jessica Campos elected to have [facility] CancerNext-Expanded Panel. The CancerNext-Expanded gene panel offered by [facility] and includes sequencing, rearrangement, and RNA analysis for the following 77 genes: AIP, ALK, APC, ATM, AXIN2, BAP1, BARD1, BMPR1A, BRCA1, BRCA2, BRIP1, CDC73, CDH1, CDK4, CDKN1B, CDKN2A, CEBPA, CHEK2, CTNNA1, DDX41, DICER1, ETV6, FH, FLCN, GATA2, LZTR1, MAX, MBD4, MEN1, MET, MLH1, MSH2, MSH3, MSH6, MUTYH, NF1, NF2, NTHL1, PALB2, PHOX2B, PMS2, POT1, PRKAR1A, PTCH1, PTEN, RAD51C, RAD51D, RB1, RET, RPS20, RUNX1, SDHA, SDHAF2, SDHB, SDHC, SDHD, SMAD4, SMARCA4, SMARCB1, SMARCE1, STK11,  SUFU, TMEM127, TP53, TSC1, TSC2, VHL, and WT1 (sequencing and deletion/duplication); EGFR, HOXB13, KIT, MITF, PDGFRA, POLD1, and POLE (sequencing only); EPCAM and GREM1 (deletion/duplication only).    Based on Ms. Shortridge's personal and family history of cancer, she meets medical criteria for genetic testing. Despite that she meets criteria, she may still have an out of pocket cost. We discussed that if her out of pocket cost for testing is over $100, the laboratory should contact them to discuss self-pay prices, patient pay assistance programs, if applicable, and other billing options.  PLAN: After considering the risks, benefits, and limitations, Ms. Nicolls provided informed consent to pursue genetic testing and the blood sample was sent to [facility] for analysis of the CancerNext-Expanded Panel. Of note, we are not ordering a STAT panel because Ms.  Savitt plans to proceed with a bilateral mastectomy. Results should be available within approximately 2-3 weeks' time, at which point they will be disclosed by telephone to Ms. Radman, as will any additional recommendations warranted by these results. Ms. Rostro will receive a summary of her genetic counseling visit and a copy of her results once available. This information will also be available in Epic.   Ms. Sherman questions were answered to her satisfaction today. Our contact information was provided should additional questions or concerns arise. Thank you for the referral and allowing us  to share in the care of your patient.   Theseus Birnie, MS, [facility] Genetic Counselor [email or web address] (P) [phone]  40 minutes were spent on the date of the encounter in service to the patient including preparation, face-to-face consultation, documentation and care coordination.  The patient brought her husband.  Drs. Gudena and/or Maryalice Smaller were available to discuss this case as needed.    _______________________________________________________________________ For Office Staff:  Number of people involved in session: 2 Was an Intern/ student involved with case: no

## 2024-03-05 ENCOUNTER — Telehealth: Payer: Self-pay | Admitting: *Deleted

## 2024-03-05 NOTE — Telephone Encounter (Signed)
 Spoke to pt concerning [facility] from 03/04/24. Denies questions or concerns regarding dx or treatment care plan. Pt would like to discuss starting Tamoxifen prior to surgery as it is not scheduled until 05/12/24 per pt going out of town. Scheduled and confirmed appt with Dr. Arno Bibles on 5/2 at 1pm. Physician team notified of pt request.  Encourage pt to call with needs. Received verbal understanding.

## 2024-03-06 ENCOUNTER — Encounter: Payer: Self-pay | Admitting: *Deleted

## 2024-03-06 ENCOUNTER — Inpatient Hospital Stay: Attending: Hematology and Oncology | Admitting: Hematology and Oncology

## 2024-03-06 DIAGNOSIS — Z170 Estrogen receptor positive status [ER+]: Secondary | ICD-10-CM | POA: Diagnosis not present

## 2024-03-06 DIAGNOSIS — D0511 Intraductal carcinoma in situ of right breast: Secondary | ICD-10-CM

## 2024-03-06 NOTE — Progress Notes (Signed)
 [facility] CONSULT NOTE  Patient Care Team: Zilphia Hilt, Charyl Coppersmith, MD as PCP - General (Internal Medicine) Alane Hsu, RN as Oncology Nurse Navigator Auther Bo, RN as Oncology Nurse Navigator Sim Dryer, MD as Consulting Physician (General Surgery) Murleen Arms, MD as Consulting Physician (Hematology and Oncology) Retta Caster, MD as Consulting Physician (Radiation Oncology)  CHIEF COMPLAINTS/PURPOSE OF CONSULTATION:  Newly diagnosed breast cancer  HISTORY OF PRESENTING ILLNESS:  Jessica Campos 43 y.o. female is here because of recent diagnosis of right breast IDC  I reviewed her records extensively and collaborated the history with the patient.  SUMMARY OF ONCOLOGIC HISTORY: Oncology History  Ductal carcinoma in situ (DCIS) of right breast  03/02/2024 Initial Diagnosis   Ductal carcinoma in situ (DCIS) of right breast   03/04/2024 Cancer Staging   Staging form: Breast, AJCC 8th Edition - Clinical stage from 03/04/2024: Stage 0 (cTis (DCIS), cN0, cM0, ER+, PR+) - Signed by Murleen Arms, MD on 03/04/2024 Stage prefix: Initial diagnosis Nuclear grade: G2    Discussed the use of AI scribe software for clinical note transcription with the patient, who gave verbal consent to proceed.  History of Present Illness Jessica Campos is a 43 year old female with stage 0 breast cancer (DCIS) who presents for telephone follow up. She had a conversation with our NN and was wondering if she should take tamoxifen, hence this call. She is very worried about the possibility of stroke especially since her BP is high, cholesterol levels are high and since there is high incidence of stroke in young women. Rest of the pertinent 10 point ROS reviewed and neg.  MEDICAL HISTORY:  Past Medical History:  Diagnosis Date   Ankle fracture 2005   Right   Anxiety    Breast cancer (HCC)    Elevated blood pressure reading    after second c section,  resolved on its own, not currently on BP medications   Fibroids    High cholesterol    Joint pain    Obesity (BMI 30-39.9)    PONV (postoperative nausea and vomiting)    Seasonal allergies    Vitamin D  deficiency     SURGICAL HISTORY: Past Surgical History:  Procedure Laterality Date   ANKLE SURGERY Right 2005   BREAST BIOPSY Right 02/25/2024   US  RT BREAST BX W LOC DEV 1ST LESION IMG BX SPEC US  GUIDE 02/25/2024 [facility] MAMMOGRAP[facility]   CESAREAN SECTION     x2   FOOT SURGERY Right 2013   PARTIAL HYSTERECTOMY      SOCIAL HISTORY: Social History   Socioeconomic History   Marital status: Married    Spouse name: Not on file   Number of children: Not on file   Years of education: Not on file   Highest education level: Not on file  Occupational History   Not on file  Tobacco Use   Smoking status: Never   Smokeless tobacco: Never  Vaping Use   Vaping status: Never Used  Substance and Sexual Activity   Alcohol use: Yes    Comment: occasional   Drug use: Never   Sexual activity: Yes    Birth control/protection: I.U.D.    Comment: mirena 10/2010  Other Topics Concern   Not on file  Social History Narrative   Not on file   Social Drivers of Health   Financial Resource Strain: Not on file  Food Insecurity: No Food Insecurity (03/04/2024)   Hunger Vital Sign  Worried About Programme researcher, broadcasting/film/video in the Last Year: Never true    Ran Out of Food in the Last Year: Never true  Transportation Needs: No Transportation Needs (03/04/2024)   PRAPARE - Administrator, Civil Service (Medical): No    Lack of Transportation (Non-Medical): No  Physical Activity: Not on file  Stress: Not on file  Social Connections: Unknown (06/16/2023)   [facility]   Social Network    Social Network: Not on file  Intimate Partner Violence: Not At Risk (03/04/2024)   Humiliation, Afraid, Rape, and Kick questionnaire    Fear of Current or Ex-Partner: No    Emotionally Abused:  No    Physically Abused: No    Sexually Abused: No    FAMILY HISTORY: Family History  Problem Relation Age of Onset   Hyperlipidemia Mother    Hypertension Mother    Obesity Mother    Obesity Father    Stroke Father    Hyperlipidemia Father    Diabetes Father    Hypertension Father    Diabetes type II Father    Alcoholism Father    Prostate cancer Maternal Uncle 80   Prostate cancer Maternal Uncle 63       metastatic   Breast cancer Paternal Aunt 50       recurred at 57   Breast cancer Other 90       maternal great aunt    ALLERGIES:  has no allergies on file.  MEDICATIONS:  Current Outpatient Medications  Medication Sig Dispense Refill   Bacillus Coagulans-Inulin (PROBIOTIC) 1-250 BILLION-MG CAPS Take by mouth.     Boric Acid 600 MG SUPP      cetirizine (ZYRTEC) 10 MG tablet Take 10 mg by mouth daily.     Ibuprofen -diphenhydrAMINE Cit (ADVIL  PM) 200-38 MG TABS Take by mouth.     Omega-3 Fatty Acids (FISH OIL) 1000 MG CAPS      Red Yeast Rice Extract 600 MG CAPS      No current facility-administered medications for this visit.    PHYSICAL EXAMINATION: ECOG PERFORMANCE STATUS: 0 - Asymptomatic  There were no vitals filed for this visit.  There were no vitals filed for this visit.   Telephone visit.  LABORATORY DATA:  I have reviewed the data as listed Lab Results  Component Value Date   WBC 5.9 03/04/2024   HGB 13.6 03/04/2024   HCT 39.4 03/04/2024   MCV 88.1 03/04/2024   PLT 364 03/04/2024   Lab Results  Component Value Date   NA 137 03/04/2024   K 3.7 03/04/2024   CL 103 03/04/2024   CO2 26 03/04/2024    RADIOGRAPHIC STUDIES: I have personally reviewed the radiological reports and agreed with the findings in the report.  ASSESSMENT AND PLAN:  Ductal carcinoma in situ (DCIS) of right breast Assessment and Plan Assessment & Plan Ductal carcinoma in situ of breast  Pathology review: I discussed with the patient the difference between DCIS  and invasive breast cancer. It is considered a precancerous lesion. DCIS is classified as a Stage 0 breast cancer. It is generally detected through mammograms as calcifications. We discussed the significance of grades and its impact on prognosis. We also discussed the importance of ER and PR receptors and their implications to adjuvant treatment options. Prognosis of DCIS dependence on grade and degree of comedo necrosis. It is anticipated that if not treated, 20-30% of DCIS can develop into invasive breast cancer.  DCIS,  stage 0, ER/PR positive. Potential for upstaging to invasive cancer upon surgical evaluation.  Discussed role of tamoxifen adjuvant in case invasive breast cancer is diagnosed.  Emphasized lifestyle modifications and genetic testing due to family history. Pt prefers to proceed with bilateral mastectomy. During our most recent visit and today's visit, we have discussed about tamoxifen, adverse effects of tamoxifen including but not limited to post menopausal symptoms, risk of DVT/PE, risk of endometrial complications, ? Increased risk of cardiovascular events and benefit on bone density. I got a message from our NN that she may be interested in taking tamoxifen before surgery because she doesn't want to do surgery till July, has a vacation planned. She today after having thought about cardiovascular risk factors is hesitant especially since there may not be a huge benefit. I encouraged her to call us  back if she changes her mind.  I connected with  Jessica Barrette Scarfone on 03/06/24 by a telephone application and verified that I am speaking with the correct person using two identifiers.   I discussed the limitations of evaluation and management by telemedicine. The patient expressed understanding and agreed to proceed.  Location of provider: office Location of patient: Home.      All questions were answered. The patient knows to call the clinic with any problems, questions or  concerns.    Murleen Arms, MD 03/06/24

## 2024-03-06 NOTE — Assessment & Plan Note (Signed)
 Assessment and Plan Assessment & Plan Ductal carcinoma in situ of breast  Pathology review: I discussed with the patient the difference between DCIS and invasive breast cancer. It is considered a precancerous lesion. DCIS is classified as a Stage 0 breast cancer. It is generally detected through mammograms as calcifications. We discussed the significance of grades and its impact on prognosis. We also discussed the importance of ER and PR receptors and their implications to adjuvant treatment options. Prognosis of DCIS dependence on grade and degree of comedo necrosis. It is anticipated that if not treated, 20-30% of DCIS can develop into invasive breast cancer.  DCIS, stage 0, ER/PR positive. Potential for upstaging to invasive cancer upon surgical evaluation.  Discussed role of tamoxifen adjuvant in case invasive breast cancer is diagnosed.  Emphasized lifestyle modifications and genetic testing due to family history. Pt prefers to proceed with bilateral mastectomy. During our most recent visit and today's visit, we have discussed about tamoxifen, adverse effects of tamoxifen including but not limited to post menopausal symptoms, risk of DVT/PE, risk of endometrial complications, ? Increased risk of cardiovascular events and benefit on bone density. I got a message from our NN that she may be interested in taking tamoxifen before surgery because she doesn't want to do surgery till July, has a vacation planned. She today after having thought about cardiovascular risk factors is hesitant especially since there may not be a huge benefit. I encouraged her to call us  back if she changes her mind.  I connected with  Annamaria Barrette Dundon on 03/06/24 by a telephone application and verified that I am speaking with the correct person using two identifiers.   I discussed the limitations of evaluation and management by telemedicine. The patient expressed understanding and agreed to proceed.  Location of  provider: office Location of patient: Home.

## 2024-03-09 ENCOUNTER — Ambulatory Visit: Admitting: Internal Medicine

## 2024-03-10 ENCOUNTER — Encounter (HOSPITAL_BASED_OUTPATIENT_CLINIC_OR_DEPARTMENT_OTHER): Payer: Self-pay | Admitting: Surgery

## 2024-03-10 ENCOUNTER — Other Ambulatory Visit: Payer: Self-pay

## 2024-03-13 ENCOUNTER — Other Ambulatory Visit (INDEPENDENT_AMBULATORY_CARE_PROVIDER_SITE_OTHER)

## 2024-03-13 DIAGNOSIS — E782 Mixed hyperlipidemia: Secondary | ICD-10-CM

## 2024-03-13 LAB — LIPID PANEL
Cholesterol: 174 mg/dL (ref 0–200)
HDL: 60.5 mg/dL (ref >39.00)
LDL Cholesterol: 105 mg/dL — ABNORMAL HIGH (ref 0–99)
NonHDL: 113.15
Total CHOL/HDL Ratio: 3
Triglycerides: 43 mg/dL (ref 0.0–149.0)
VLDL: 8.6 mg/dL (ref 0.0–40.0)

## 2024-03-16 ENCOUNTER — Encounter: Payer: Self-pay | Admitting: Internal Medicine

## 2024-03-18 ENCOUNTER — Ambulatory Visit: Admitting: Family Medicine

## 2024-03-18 ENCOUNTER — Encounter: Payer: Self-pay | Admitting: Genetic Counselor

## 2024-03-18 ENCOUNTER — Encounter: Payer: Self-pay | Admitting: Family Medicine

## 2024-03-18 ENCOUNTER — Ambulatory Visit: Payer: Self-pay | Admitting: Genetic Counselor

## 2024-03-18 VITALS — BP 142/86 | HR 70 | Temp 98.0°F | Ht 62.0 in | Wt 157.0 lb

## 2024-03-18 DIAGNOSIS — Z1379 Encounter for other screening for genetic and chromosomal anomalies: Secondary | ICD-10-CM | POA: Insufficient documentation

## 2024-03-18 DIAGNOSIS — E785 Hyperlipidemia, unspecified: Secondary | ICD-10-CM | POA: Diagnosis not present

## 2024-03-18 DIAGNOSIS — R948 Abnormal results of function studies of other organs and systems: Secondary | ICD-10-CM

## 2024-03-18 DIAGNOSIS — Z6828 Body mass index (BMI) 28.0-28.9, adult: Secondary | ICD-10-CM

## 2024-03-18 DIAGNOSIS — D0511 Intraductal carcinoma in situ of right breast: Secondary | ICD-10-CM

## 2024-03-18 DIAGNOSIS — E663 Overweight: Secondary | ICD-10-CM | POA: Diagnosis not present

## 2024-03-18 NOTE — Patient Instructions (Signed)
 Keep up the good work!  Hydrate well with water   Track intake using the My[employer] Aim for 1500 cal/ day This should include 85-100 g of protein daily Great job staying off high sugar items!  Aim for a good 30 min of walking daily (or cycle) Add in weight training 2 days/ wk -- change to [facility] or Pilates

## 2024-03-18 NOTE — Progress Notes (Signed)
 Office: [phone]  /  Fax: [phone]  WEIGHT SUMMARY AND BIOMETRICS  Starting Date: 01/20/24  Starting Weight: 163lb   Weight Lost Since Last Visit: 2lb   Vitals Temp: 98 F (36.7 C) BP: (!) 142/86 Pulse Rate: 70 SpO2: 100 %   Body Composition  Body Fat %: 32.3 % Fat Mass (lbs): 50.8 lbs Muscle Mass (lbs): 101 lbs Total Body Water  (lbs): 72 lbs Visceral Fat Rating : 6     HPI  Chief Complaint: OBESITY  Naliah is here to discuss her progress with her obesity treatment plan. She is on the the Category 3 Plan and states she is following her eating plan approximately 90 % of the time. She states she is exercising 30-60 minutes 6 times per week.   Interval History:  Since last office visit she is down 2 lb She is down 1 lb of muscle mass and down 0.8 lb of body fat This gives her a net weight loss of 6 lb in 8 wks She is scheduled for bilateral mastectomy 7/8 for R breast DCIS Her husband has been supportive She is doing spin workouts 3 days/ wk, some walking and weight training from home 2 days/ wk She is going to the [place] the end of June  Goal is to be as healthy as possible leading up to her surgery  Pharmacotherapy: none  PHYSICAL EXAM:  Blood pressure (!) 142/86, pulse 70, temperature 98 F (36.7 C), height 5\' 2"  (1.575 m), weight 157 lb (71.2 kg), last menstrual period 08/05/2018, SpO2 100%. Body mass index is 28.72 kg/m.  General: She is healthy appearing, cooperative, alert, well developed, and in no acute distress. PSYCH: Has normal mood, affect and thought process.   Lungs: Normal breathing effort, no conversational dyspnea.   ASSESSMENT AND PLAN  TREATMENT PLAN FOR OBESITY:  Recommended Dietary Goals  Dessence is currently in the action stage of change. As such, her goal is to continue weight management plan. She has agreed to the Category 3 Plan and keeping a food journal and adhering to recommended goals of 1500 calories and 85-100 g  of  protein.  Behavioral Intervention  We discussed the following Behavioral Modification Strategies today: increasing lean protein intake to established goals, increasing fiber rich foods, increasing water  intake , work on meal planning and preparation, work on Counselling psychologist calories using tracking application, keeping healthy foods at home, work on managing stress, creating time for self-care and relaxation, avoiding temptations and identifying enticing environmental cues, continue to practice mindfulness when eating, and continue to work on maintaining a reduced calorie state, getting the recommended amount of protein, incorporating whole foods, making healthy choices, staying well hydrated and practicing mindfulness when eating..  Additional resources provided today: NA  Recommended Physical Activity Goals  Boluwatife has been advised to work up to 150 minutes of moderate intensity aerobic activity a week and strengthening exercises 2-3 times per week for cardiovascular health, weight loss maintenance and preservation of muscle mass.   She has agreed to Increase the intensity, frequency or duration of strengthening exercises  and Increase the intensity, frequency or duration of aerobic exercises   Recommend either a trainer for weight resistance OR changing to pilates or barre Keep cardio 30+ min 3+ days a week and increasing daily steps  Pharmacotherapy changes for the treatment of obesity: none  ASSOCIATED CONDITIONS ADDRESSED TODAY  Low basal metabolic rate She is doing a better job of getting adequate sleep, protein intake with meals and  regular exercise She has lost only a pound of muscle from her start date  Plan to repeat fasting IC in Sept  Overweight (BMI 25.0-29.9)  BMI 28.0-28.9,adult  Hyperlipidemia, unspecified hyperlipidemia type Lab Results  Component Value Date   CHOL 174 03/13/2024   HDL 60.50 03/13/2024   LDLCALC 105 (H) 03/13/2024   TRIG 43.0 03/13/2024    CHOLHDL 3 03/13/2024   She has had big improvements  in LDL with dietary change and use of Red Yeast Rice Continue current plan of care  Ductal carcinoma in situ (DCIS) of right breast Reviewed her notes and treatment plan She looks forward to bilateral mastectomy and then reconstructive surgery Continue plan to stay healthy/ aim for a body fat % close to 30 for upcoming surgery and to reduce risk for future weight related cancers      She was informed of the importance of frequent follow up visits to maximize her success with intensive lifestyle modifications for her multiple health conditions.   ATTESTASTION STATEMENTS:  Reviewed by clinician on day of visit: allergies, medications, problem list, medical history, surgical history, family history, social history, and previous encounter notes pertinent to obesity diagnosis.   I have personally spent 24 minutes total time today in preparation, patient care, nutritional counseling and education,  and documentation for this visit, including the following: review of most recent clinical lab tests, prescribing medications/ refilling medications, reviewing medical assistant documentation, review and interpretation of bioimpedence results.     Micky Albee, D.O. DABFM, DABOM [facility] [address] [place], [place] [address] [phone]

## 2024-03-18 NOTE — Progress Notes (Unsigned)
 HPI:   Ms. Cutrone was previously seen in the [facility] clinic due to a personal and family history of cancer and concerns regarding a hereditary predisposition to cancer. Please refer to our prior cancer genetics clinic note for more information regarding our discussion, assessment and recommendations, at the time. Ms. Hinck recent genetic test results were disclosed to her, as were recommendations warranted by these results. These results and recommendations are discussed in more detail below.  CANCER HISTORY:  Oncology History  Ductal carcinoma in situ (DCIS) of right breast  03/02/2024 Initial Diagnosis   Ductal carcinoma in situ (DCIS) of right breast   03/04/2024 Cancer Staging   Staging form: Breast, AJCC 8th Edition - Clinical stage from 03/04/2024: Stage 0 (cTis (DCIS), cN0, cM0, ER+, PR+) - Signed by Murleen Arms, MD on 03/04/2024 Stage prefix: Initial diagnosis Nuclear grade: G2     FAMILY HISTORY:  We obtained a detailed, 4-generation family history.  Significant diagnoses are listed below:      Family History  Problem Relation Age of Onset   Hyperlipidemia Mother     Hypertension Mother     Obesity Mother     Obesity Father     Stroke Father     Hyperlipidemia Father     Diabetes Father     Hypertension Father     Diabetes type II Father     Alcoholism Father     Prostate cancer Maternal Uncle 54   Prostate cancer Maternal Uncle 67        metastatic   Breast cancer Paternal Aunt 50        recurred at 10   Breast cancer Other 76        maternal great aunt           Ms. Garverick is unaware of previous family history of genetic testing for hereditary cancer risks. There is no reported Ashkenazi Jewish ancestry.   GENETIC TEST RESULTS:  The [facility] CancerNext-Expanded Panel found no pathogenic mutations.   The CancerNext-Expanded gene panel offered by [facility] and includes sequencing, rearrangement, and RNA analysis for the following  77 genes: AIP, ALK, APC, ATM, AXIN2, BAP1, BARD1, BMPR1A, BRCA1, BRCA2, BRIP1, CDC73, CDH1, CDK4, CDKN1B, CDKN2A, CEBPA, CHEK2, CTNNA1, DDX41, DICER1, ETV6, FH, FLCN, GATA2, LZTR1, MAX, MBD4, MEN1, MET, MLH1, MSH2, MSH3, MSH6, MUTYH, NF1, NF2, NTHL1, PALB2, PHOX2B, PMS2, POT1, PRKAR1A, PTCH1, PTEN, RAD51C, RAD51D, RB1, RET, RPS20, RUNX1, SDHA, SDHAF2, SDHB, SDHC, SDHD, SMAD4, SMARCA4, SMARCB1, SMARCE1, STK11, SUFU, TMEM127, TP53, TSC1, TSC2, VHL, and WT1 (sequencing and deletion/duplication); EGFR, HOXB13, KIT, MITF, PDGFRA, POLD1, and POLE (sequencing only); EPCAM and GREM1 (deletion/duplication only).    The test report has been scanned into EPIC and is located under the Molecular Pathology section of the Results Review tab.  A portion of the result report is included below for reference. Genetic testing reported out on 03/16/2024.      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  At this time, it is unknown if this variant is associated with an increased risk for cancer or if it is benign, but most uncertain variants are reclassified to benign. It should not be used to make medical management decisions. With time, we suspect the laboratory will determine the significance of this variant, if any. If the laboratory reclassifies this variant, we will attempt to contact Ms. Raffel to discuss it further.   Even though a pathogenic variant was not  identified, possible explanations for the cancer in the family may include: There may be no hereditary risk for cancer in the family. The cancers in Ms. Bulthuis and/or her family may be due to other genetic or environmental factors. There may be a gene mutation in one of these genes that current testing methods cannot detect, but that chance is small. There could be another gene that has not yet been discovered, or that we have not yet tested, that is responsible for the cancer diagnoses in the family.  It  is also possible there is a hereditary cause for the cancer in the family that Ms. Viger did not inherit.  Therefore, it is important to remain in touch with cancer genetics in the future so that we can continue to offer Ms. Tango the most up to date genetic testing.   ADDITIONAL GENETIC TESTING:  We discussed with Ms. Clinch that her genetic testing was fairly extensive.  If there are genes identified to increase cancer risk that can be analyzed in the future, we would be happy to discuss and coordinate this testing at that time.    CANCER SCREENING RECOMMENDATIONS:  Ms. Gentzel test result is considered negative (normal).  This means that we have not identified a hereditary cause for her personal and family history of cancer at this time.   An individual's cancer risk and medical management are not determined by genetic test results alone. Overall cancer risk assessment incorporates additional factors, including personal medical history, family history, and any available genetic information that may result in a personalized plan for cancer prevention and surveillance. Therefore, it is recommended she continue to follow the cancer management and screening guidelines provided by her oncology and primary healthcare provider.  RECOMMENDATIONS FOR FAMILY MEMBERS:   Since she did not inherit a mutation in a cancer predisposition gene included on this panel, her children could not have inherited a mutation from her in one of these genes. Individuals in this family might be at some increased risk of developing cancer, over the general population risk, due to the family history of cancer. We recommend women in this family have a yearly mammogram beginning at age 63, or 92 years younger than the earliest onset of cancer, an annual clinical breast exam, and perform monthly breast self-exams. We do not recommend familial testing for the SUFU variant of uncertain significance (VUS). Other members of the  family may still carry a pathogenic variant in one of these genes that Ms. Fuster did not inherit. Based on the family history, we recommend her maternal uncle, who was diagnosed with prostate cancer, have genetic counseling and testing.   FOLLOW-UP:  Cancer genetics is a rapidly advancing field and it is possible that new genetic tests will be appropriate for her and/or her family members in the future. We encouraged her to remain in contact with cancer genetics on an annual basis so we can update her personal and family histories and let her know of advances in cancer genetics that may benefit this family.   Our contact number was provided. Ms. Tuberville questions were answered to her satisfaction, and she knows she is welcome to call us  at anytime with additional questions or concerns.   Nester Bachus, MS, [facility] Genetic Counselor [email or web address] (P) [phone]

## 2024-03-19 ENCOUNTER — Telehealth: Payer: Self-pay | Admitting: Genetic Counselor

## 2024-03-19 ENCOUNTER — Encounter: Payer: Self-pay | Admitting: Genetic Counselor

## 2024-03-19 NOTE — Telephone Encounter (Signed)
 I contacted Jessica Campos to discuss her genetic testing results. No pathogenic variants were identified in the 77 genes analyzed. 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 Detailed clinic note to follow.  The test report has been scanned into EPIC and is located under the Molecular Pathology section of the Results Review tab.  A portion of the result report is included below for reference.   Hanaan Gancarz, MS, [facility] Genetic Counselor [email or web address] (P) [phone]

## 2024-04-13 ENCOUNTER — Ambulatory Visit: Admitting: Family Medicine

## 2024-04-21 ENCOUNTER — Encounter: Payer: Self-pay | Admitting: Family Medicine

## 2024-04-21 ENCOUNTER — Other Ambulatory Visit: Payer: Self-pay | Admitting: Adult Health

## 2024-04-21 ENCOUNTER — Encounter: Payer: Self-pay | Admitting: Internal Medicine

## 2024-04-21 ENCOUNTER — Ambulatory Visit: Admitting: Family Medicine

## 2024-04-21 VITALS — BP 146/78 | HR 65 | Temp 98.6°F | Ht 62.0 in | Wt 156.0 lb

## 2024-04-21 DIAGNOSIS — D0511 Intraductal carcinoma in situ of right breast: Secondary | ICD-10-CM

## 2024-04-21 DIAGNOSIS — R948 Abnormal results of function studies of other organs and systems: Secondary | ICD-10-CM | POA: Diagnosis not present

## 2024-04-21 DIAGNOSIS — R030 Elevated blood-pressure reading, without diagnosis of hypertension: Secondary | ICD-10-CM | POA: Diagnosis not present

## 2024-04-21 DIAGNOSIS — E663 Overweight: Secondary | ICD-10-CM

## 2024-04-21 DIAGNOSIS — Z6828 Body mass index (BMI) 28.0-28.9, adult: Secondary | ICD-10-CM

## 2024-04-21 MED ORDER — SCOPOLAMINE 1 MG/3DAYS TD PT72: 1.0000 | MEDICATED_PATCH | TRANSDERMAL | 0 refills | Status: DC

## 2024-04-21 NOTE — Progress Notes (Signed)
 Office: [phone]  /  Fax: [phone]  WEIGHT SUMMARY AND BIOMETRICS  Starting Date: 01/20/24  Starting Weight: 163lb   Weight Lost Since Last Visit: 1lb   Vitals Temp: 98.6 F (37 C) BP: (!) 146/78 Pulse Rate: 65 SpO2: 100 %   Body Composition  Body Fat %: 30.7 % Fat Mass (lbs): 47.8 lbs Muscle Mass (lbs): 102.8 lbs Total Body Water  (lbs): 70.4 lbs Visceral Fat Rating : 6    HPI  Chief Complaint: OBESITY  Krysta is here to discuss her progress with her obesity treatment plan. She is on the the Category 3 Plan and states she is following her eating plan approximately 80 % of the time. She states she is exercising 30-60 minutes 7 times per week.  Interval History:  Since last office visit she is down 1 lb She is up 1.8 lb of muscle mass and down 3 lb of body fat since last visit This gives her a net weight loss of 11 lb in 3 mos She has adequate satiety on 1500 cal/ day meal plan Denies hunger or cravings Getting ready to go on a cruise this month Scheduled for bilateral mastectomy 7/8 with Dr Afton Horse for R DCIS Doing Peloton resistance training workouts 2-3 x a week and cardio rides/ walks 5 days/ wk  Pharmacotherapy: none  PHYSICAL EXAM:  Blood pressure (!) 146/78, pulse 65, temperature 98.6 F (37 C), height 5' 2 (1.575 m), weight 156 lb (70.8 kg), last menstrual period 08/05/2018, SpO2 100%. Body mass index is 28.53 kg/m.  General: She is healthy appearing,  cooperative, alert, well developed, and in no acute distress. PSYCH: Has normal mood, affect and thought process.   Lungs: Normal breathing effort, no conversational dyspnea.   ASSESSMENT AND PLAN  TREATMENT PLAN FOR OBESITY:  Recommended Dietary Goals  Stephana is currently in the action stage of change. As such, her goal is to continue weight management plan. She has agreed to the Category 3 Plan.  Behavioral Intervention  We discussed the following Behavioral Modification Strategies  today: increasing lean protein intake to established goals, increasing fiber rich foods, increasing water  intake , work on meal planning and preparation, keeping healthy foods at home, planning for success, and continue to work on maintaining a reduced calorie state, getting the recommended amount of protein, incorporating whole foods, making healthy choices, staying well hydrated and practicing mindfulness when eating..  Additional resources provided today: NA  Recommended Physical Activity Goals  Jayna has been advised to work up to 150 minutes of moderate intensity aerobic activity a week and strengthening exercises 2-3 times per week for cardiovascular health, weight loss maintenance and preservation of muscle mass.   She has agreed to Work on scheduling and tracking physical activity.  Plan for modified exercise/ rest post op  Pharmacotherapy changes for the treatment of obesity: none  ASSOCIATED CONDITIONS ADDRESSED TODAY  Ductal carcinoma in situ (DCIS) of right breast She is in good spirits with a good support system at home Scheduled for bilateral mastectomy 7/8 Aiming for body fat % <30 to reduce risk for future weight related cancers  Overweight (BMI 25.0-29.9) Improving Reviewed body composition results Keep easy healthy meals/ snacks at home in preparation for upcoming surgery  BMI 28.0-28.9,adult  Low basal metabolic rate Initial IC 3/17 low at 1296 cal/ day Doing great with improving sleep, protein intake and regular workouts Gaining lean body mass to help ramp up RMR  Plan to repeat fasting IC this Fall  Elevated  blood pressure reading BP remains mildly high No previous hx of HTN but has a + fam hx of HTN Denies HA or CP.  Stress levels are high Denies leg edema F/u with PCP Consider treatment if not improving     She was informed of the importance of frequent follow up visits to maximize her success with intensive lifestyle modifications for her multiple  health conditions.   ATTESTASTION STATEMENTS:  Reviewed by clinician on day of visit: allergies, medications, problem list, medical history, surgical history, family history, social history, and previous encounter notes pertinent to obesity diagnosis.   I have personally spent 25 minutes total time today in preparation, patient care, nutritional counseling and education,  and documentation for this visit, including the following: review of most recent clinical lab tests,  reviewing medical assistant documentation, review and interpretation of bioimpedence results.     Micky Albee, D.O. DABFM, DABOM [facility] [address] [place], [place] [address] [phone]

## 2024-04-28 MED ORDER — CHLORHEXIDINE GLUCONATE CLOTH 2 % EX PADS: 6.0000 | MEDICATED_PAD | Freq: Once | CUTANEOUS | Status: DC

## 2024-04-28 NOTE — Progress Notes (Signed)
      Enhanced Recovery after Surgery Enhanced Recovery after Surgery is a protocol used to improve the stress on your body and your recovery after surgery.  Patient Instructions  The night before surgery:  No food after midnight. ONLY clear liquids after midnight  The day of surgery (if you do NOT have diabetes):  Drink ONE (1) Pre-Surgery Clear Ensure as directed.   This drink was given to you during your hospital  pre-op appointment visit. The pre-op nurse will instruct you on the time to drink the  Pre-Surgery Ensure depending on your surgery time. Finish the drink at the designated time by the pre-op nurse.  Nothing else to drink after completing the  Pre-Surgery Clear Ensure.  The day of surgery (if you have diabetes): Drink ONE (1) Gatorade 2 (G2) as directed. This drink was given to you during your hospital  pre-op appointment visit.  The pre-op nurse will instruct you on the time to drink the   Gatorade 2 (G2) depending on your surgery time. Color of the Gatorade may vary. Red is not allowed. Nothing else to drink after completing the  Gatorade 2 (G2).         If you have questions, please contact your surgeon's office.  Surgical soap given

## 2024-04-29 NOTE — H&P (Signed)
 Subjective Patient ID: Paizlee Kinder Sadowski is a 43 y.o. female.     HPI   Returns for follow up discussion breast reconstruction, last seen May 2025. Presented following screening MMG with concern possible right breast mass and left breast asymmetry. Bilateral diagnostic MMG/right breast US  demonstrated resolution left breast asymmetry, no cystic or solid seen at the site of mammographic concern right breast. Incidental right breast mass at 10 o'clock 2 cmfn measuring 5 x 3 x 6 mm. Axilla not examined. Biopsy showed intermediate grade DCIS, ER/PR+.    Patient desires bilateral mastectomies. She has discussed NSM with Dr. Vanderbilt.   Genetics with VUS SUFU gene.    Current 36B, desires larger. Wt down 10 lbs since Jan 2025, working with Weight Management.   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 Lives with spouse and sons ages 35, 59.   Review of Systems  Psychiatric/Behavioral:  The patient is nervous/anxious.   All other systems reviewed and are negative.     Objective Physical Exam  Cardiovascular: Normal rate, regular rhythm and normal heart sounds.    Pulmonary/Chest Effort normal and breath sounds normal.    Skin   Fitzpatrick 5      Lymph: no palpable axillary adenopathy   Breasts: grade 1 ptosis bilateral no palpable masses SN to nipple R 22 L 22 cm BW R 19 L 19 cm CW 14 cm Nipple to IMF R 6 L 6 cm   Assessment/Plan Ductal carcinoma in situ (DCIS) of right breast   Plan bilateral NSM with immediate TE acellular dermis reconstruction.    Reviewed incisions, drains, OR length, hospital stay and recovery. Discussed process of expansion and implant based risks including rupture, imaging surveillance for silicone implants, infection requiring surgery or removal, contracture. Discussed future surgery dependent on adjuvant treatments.   Reviewed SSM vs NSM, both will be asensate and not stimulate. Reviewed with both risks mastectomy flap necrosis  requiring additional surgery. Reviewed the base line asymmetries of her breasts may persist post reconstruction. Discussed TE placement at time of mastectomy does not preclude autologous reconstruction in future.   Discussed use of acellular dermis in reconstruction, cadaveric source, incorporation over several weeks, risk that if has seroma or infection can act as additional nidus for infection if not incorporated. Reviewed this is off label use of ADM.   Discussed prepectoral vs sub pectoral reconstruction. Discussed with patient and benefit of this is no animation deformity, may be less pain. Risk may be more visible rippling over upper poles, greater need of ADM. Reviewed pre pectoral would require larger amount acellular dermis, more drains. Discussed any type reconstruction also risks long term displacement implant and visible rippling. If prepectoral counseled I would recommend she be comfortable with silicone implants as more options that have less rippling. She agrees to prepectoral placement.   Additional risks including but not limited to bleeding, seroma, hematoma, damage to adjacent structures, infection, asymmetry, damage to adjacent structures, need for additional procedures, unacceptable cosmetic result, blood clots in legs or lungs reviewed.    Drain teaching completed. Rx for oxycodone  robaxin and Bactrim given.   Ethlyn Ranks, MD MBA Plastic & Reconstructive Surgery  Office/ physician access line after hours [phone]

## 2024-05-07 NOTE — Anesthesia Preprocedure Evaluation (Addendum)
 Anesthesia Evaluation  Patient identified by MRN, date of birth, ID band Patient awake    Reviewed: Allergy & Precautions, NPO status , Patient's Chart, lab work & pertinent test results  History of Anesthesia Complications (+) PONV and history of anesthetic complications  Airway Mallampati: II  TM Distance: >3 FB Neck ROM: Full   Comment: Previous grade I view with MAC 3, easy mask Dental  (+) Dental Advisory Given   Pulmonary neg pulmonary ROS   Pulmonary exam normal breath sounds clear to auscultation       Cardiovascular negative cardio ROS  Rhythm:Regular Rate:Normal     Neuro/Psych  PSYCHIATRIC DISORDERS Anxiety     negative neurological ROS     GI/Hepatic negative GI ROS, Neg liver ROS,,,  Endo/Other  negative endocrine ROS    Renal/GU negative Renal ROS  negative genitourinary   Musculoskeletal negative musculoskeletal ROS (+)    Abdominal   Peds  Hematology negative hematology ROS (+)   Anesthesia Other Findings   Reproductive/Obstetrics                              Anesthesia Physical Anesthesia Plan  ASA: 2  Anesthesia Plan: General and Regional   Post-op Pain Management: Regional block*, Tylenol  PO (pre-op)*, Ketamine IV* and Dilaudid  IV   Induction: Intravenous  PONV Risk Score and Plan: 4 or greater and Ondansetron , Dexamethasone , Midazolam , Scopolamine  patch - Pre-op, Propofol  infusion, TIVA and Treatment may vary due to age or medical condition  Airway Management Planned: Oral ETT  Additional Equipment: None  Intra-op Plan:   Post-operative Plan: Extubation in OR  Informed Consent: I have reviewed the patients History and Physical, chart, labs and discussed the procedure including the risks, benefits and alternatives for the proposed anesthesia with the patient or authorized representative who has indicated his/her understanding and acceptance.     Dental  advisory given  Plan Discussed with: CRNA and Anesthesiologist  Anesthesia Plan Comments: (Discussed potential risks of nerve blocks including, but not limited to, infection, bleeding, nerve damage, seizures, pneumothorax, respiratory depression, and potential failure of the block. Alternatives to nerve blocks discussed. All questions answered.  Risks of general anesthesia discussed including, but not limited to, sore throat, hoarse voice, chipped/damaged teeth, injury to vocal cords, nausea and vomiting, allergic reactions, lung infection, heart attack, stroke, and death. All questions answered. )         Anesthesia Quick Evaluation

## 2024-05-11 ENCOUNTER — Other Ambulatory Visit: Payer: Self-pay

## 2024-05-11 NOTE — H&P (Signed)
 \Subjective   Chief Complaint: Breast Cancer  History of Present Illness: Jessica Campos is a 43 y.o. female who is seen today as an office consultation for evaluation of Breast Cancer  Patient presents for evaluation of right breast DCIS discovered on mammogram and biopsy. No other complaints. Family history of breast cancer noted.   Review of Systems: A complete review of systems was obtained from the patient. I have reviewed this information and discussed as appropriate with the patient. See HPI as well for other ROS.    Medical History: Past Medical History: Diagnosis Date Anxiety History of cancer Hyperlipidemia  There is no problem list on file for this patient.  Past Surgical History: Procedure Laterality Date JOINT REPLACEMENT   No Known Allergies  Current Outpatient Medications on File Prior to Visit Medication Sig Dispense Refill Bacillus coagulans-inulin 1 billion-250 cell-mg Cap Take by mouth cetirizine (ZYRTEC) 10 MG tablet Take 10 mg by mouth once daily levonorgestreL (MIRENA 52 MG) IUD Mirena 20 mcg/24 hours (5 yrs) 52 mg intrauterine device Take 1 device by intrauterine route as directed.  No current facility-administered medications on file prior to visit.  Family History Problem Relation Age of Onset Obesity Mother Hyperlipidemia (Elevated cholesterol) Mother High blood pressure (Hypertension) Mother Stroke Father Obesity Father High blood pressure (Hypertension) Father Hyperlipidemia (Elevated cholesterol) Father   Social History  Tobacco Use Smoking Status Never Smokeless Tobacco Never   Social History  Socioeconomic History Marital status: Unknown Tobacco Use Smoking status: Never Smokeless tobacco: Never Substance and Sexual Activity Alcohol use: Yes Drug use: Not Currently  Social Drivers of Health  Received from [employer] [facility] Housing Stability: Low Risk (02/20/2021) Received from [facility] Stability Vital Sign Unable to Pay for Housing in the Last Year: No Number of Places Lived in the Last Year: 1 Unstable Housing in the Last Year: No  Objective: There were no vitals filed for this visit. There is no height or weight on file to calculate BMI.  Physical Exam Exam conducted with a chaperone present. Cardiovascular: Pulses: Normal pulses. Chest: Breasts: Right: Normal. No mass or nipple discharge. Left: Normal. No mass or nipple discharge. Abdominal: General: Abdomen is flat. Musculoskeletal: Cervical back: Normal range of motion. Lymphadenopathy: Upper Body: Right upper body: No supraclavicular or axillary adenopathy. Left upper body: No supraclavicular or axillary adenopathy. Skin: General: Skin is warm. Neurological: General: No focal deficit present. Mental Status: She is alert. Psychiatric: Mood and Affect: Mood normal.    Labs, Imaging and Diagnostic Testing:  SURGICAL PATHOLOGY [facility] [address] [place], [place] [address] Telephone [phone] or [phone] Fax [phone]  REPORT OF SURGICAL PATHOLOGY  Accession #: [id number] Patient Name: Jessica Campos Visit # : [id number]  MRN: [id number] Physician: Alphia Mom DOB/Age October 23, 1981 (Age: 58) Gender: F Collected Date: 02/25/2024 Received Date: 02/25/2024  FINAL DIAGNOSIS  1. Breast, right, needle core biopsy, 10:00, mass, ribbon clip : - DUCTAL CARCINOMA IN SITU, INTERMEDIATE NUCLEAR GRADE, APOCRINE FEATURES NECROSIS: NOT IDENTIFIED CALCIFICATIONS: NOT IDENTIFIED DCIS LENGTH: 0.4 CM - SEE COMMEN TECHNIQUE: Bilateral screening digital craniocaudal and mediolateral oblique mammograms were obtained. Bilateral screening digital breast tomosynthesis was performed. The images were evaluated with computer-aided detection.  COMPARISON: None.  ACR Breast Density Category c: The breasts are heterogeneously dense, which  may obscure small masses.  FINDINGS: In the right breast a possible mass requires further evaluation.  In the left breast a possible asymmetry requires further  evaluation.  IMPRESSION: Further evaluation is suggested for possible mass in the right breast.  Further evaluation is suggested for possible asymmetry in the left breast.  RECOMMENDATION: Diagnostic mammogram and possibly ultrasound of both breasts. (Code:FI-B-61M)  The patient will be contacted regarding the findings, and additional imaging will be scheduled.  BI-RADS CATEGORY 0: Incomplete: Need additional imaging evaluation.  Electronically Signed By: Toribio Agreste M.D. On: 02/20/2024 10:01 Procedure Note  Agreste Toribio, MD - 02/20/2024 Formatting of this note might be different from the original. CLINICAL DATA: Screening.  EXAM: DIGITAL SCREENING BILATERAL MAMMOGRAM WITH TOMOSYNTHESIS AND CAD  TECHNIQUE: Bilateral screening digital craniocaudal and mediolateral oblique mammograms were obtained. Bilateral screening digital breast tomosynthesis was performed. The images were evaluated with computer-aided detection.  COMPARISON: None.  ACR Breast Density Category c: The breasts are heterogeneously dense, which may obscure small masses.  FINDINGS: In the right breast a possible mass requires further evaluation.  In the left breast a possible asymmetry requires further evaluation.  IMPRESSION: Further evaluation is suggested for possible mass in the right breast.  Further evaluation is suggested for possible asymmetry in the left breast.  RECOMMENDATION: Diagnostic mammogram and possibly ultrasound of both breasts. (Code:FI-B-61M)  The patient will be contacted regarding the findings, and additional imaging will be scheduled.  BI-RADS CATEGORY 0: Incomplete: Need additional imaging evaluation.  Electronically Signed By: Toribio Agreste M.D. On: 02/20/2024 10:01 Exam End: 02/17/24 13:47 Last  Resulted: 02/20/24 10:01 Received From: [place]  Assessment and Plan:  Diagnoses and all orders for this visit:  Ductal carcinoma in situ (DCIS) of right breast   Pt has chosen right NSM and left risk reducing NSM after reviewing all options, complications, treatments and outcomes. She has concern for new disease at her young age which is 0.5% per year which makes her risk at least 10 % over 20 years if not more No interest at this time in breast conservation. Risk of bleeding , infection, nipple loss, loss of sensation, recurrent surgery, poor cosmesis and the need for other treatments and procedures. Refer to plastics.   DEBBY CURTISTINE SHIPPER, MD  I spent a total of 48 minutes in both face-to-face and non-face-to-face activities, excluding procedures performed, for this visit on the date of this encounter.

## 2024-05-12 ENCOUNTER — Encounter (HOSPITAL_BASED_OUTPATIENT_CLINIC_OR_DEPARTMENT_OTHER): Payer: Self-pay | Admitting: Surgery

## 2024-05-12 ENCOUNTER — Ambulatory Visit (HOSPITAL_BASED_OUTPATIENT_CLINIC_OR_DEPARTMENT_OTHER): Payer: Self-pay | Admitting: Anesthesiology

## 2024-05-12 ENCOUNTER — Other Ambulatory Visit: Payer: Self-pay

## 2024-05-12 ENCOUNTER — Encounter (HOSPITAL_BASED_OUTPATIENT_CLINIC_OR_DEPARTMENT_OTHER)
Admission: RE | Disposition: A | Discharge status: Home / Self Care | Payer: Self-pay | Source: Home / Self Care | Attending: Surgery

## 2024-05-12 ENCOUNTER — Ambulatory Visit (HOSPITAL_BASED_OUTPATIENT_CLINIC_OR_DEPARTMENT_OTHER)
Admission: RE | Admit: 2024-05-12 | Discharge: 2024-05-13 | Disposition: A | Discharge status: Home / Self Care | Attending: Plastic Surgery | Admitting: Plastic Surgery

## 2024-05-12 DIAGNOSIS — E669 Obesity, unspecified: Secondary | ICD-10-CM | POA: Diagnosis not present

## 2024-05-12 DIAGNOSIS — Z170 Estrogen receptor positive status [ER+]: Secondary | ICD-10-CM | POA: Diagnosis not present

## 2024-05-12 DIAGNOSIS — Z803 Family history of malignant neoplasm of breast: Secondary | ICD-10-CM | POA: Insufficient documentation

## 2024-05-12 DIAGNOSIS — D0511 Intraductal carcinoma in situ of right breast: Secondary | ICD-10-CM | POA: Insufficient documentation

## 2024-05-12 DIAGNOSIS — Z1721 Progesterone receptor positive status: Secondary | ICD-10-CM | POA: Insufficient documentation

## 2024-05-12 DIAGNOSIS — D0510 Intraductal carcinoma in situ of unspecified breast: Secondary | ICD-10-CM | POA: Diagnosis present

## 2024-05-12 DIAGNOSIS — Z6830 Body mass index (BMI) 30.0-30.9, adult: Secondary | ICD-10-CM | POA: Insufficient documentation

## 2024-05-12 DIAGNOSIS — Z01818 Encounter for other preprocedural examination: Secondary | ICD-10-CM

## 2024-05-12 DIAGNOSIS — N6012 Diffuse cystic mastopathy of left breast: Secondary | ICD-10-CM | POA: Insufficient documentation

## 2024-05-12 HISTORY — PX: BREAST RECONSTRUCTION: SHX9

## 2024-05-12 HISTORY — PX: NIPPLE SPARING MASTECTOMY: SHX6537

## 2024-05-12 SURGERY — MASTECTOMY, NIPPLE SPARING
Anesthesia: Regional | Site: Breast | Laterality: Bilateral

## 2024-05-12 MED ORDER — SCOPOLAMINE 1 MG/3DAYS TD PT72
MEDICATED_PATCH | TRANSDERMAL | Status: AC
  Filled 2024-05-12: qty 1

## 2024-05-12 MED ORDER — ONDANSETRON HCL 4 MG/2ML IJ SOLN: 4.0000 mg | Freq: Four times a day (QID) | INTRAMUSCULAR | Status: DC | PRN

## 2024-05-12 MED ORDER — FENTANYL CITRATE (PF) 100 MCG/2ML IJ SOLN
INTRAMUSCULAR | Status: AC
Start: 2024-05-12 — End: 2024-05-12
  Filled 2024-05-12: qty 2

## 2024-05-12 MED ORDER — MIDAZOLAM HCL 2 MG/2ML IJ SOLN
INTRAMUSCULAR | Status: AC
  Filled 2024-05-12: qty 2

## 2024-05-12 MED ORDER — AMISULPRIDE (ANTIEMETIC) 5 MG/2ML IV SOLN
10.0000 mg | Freq: Once | INTRAVENOUS | Status: AC | PRN
  Administered 2024-05-12: 10 mg via INTRAVENOUS

## 2024-05-12 MED ORDER — PHENYLEPHRINE 80 MCG/ML (10ML) SYRINGE FOR IV PUSH (FOR BLOOD PRESSURE SUPPORT)
PREFILLED_SYRINGE | INTRAVENOUS | Status: AC
  Filled 2024-05-12: qty 10

## 2024-05-12 MED ORDER — FENTANYL CITRATE (PF) 100 MCG/2ML IJ SOLN
50.0000 ug | Freq: Once | INTRAMUSCULAR | Status: AC
  Administered 2024-05-12: 50 ug via INTRAVENOUS

## 2024-05-12 MED ORDER — CEFAZOLIN SODIUM-DEXTROSE 2-4 GM/100ML-% IV SOLN
INTRAVENOUS | Status: AC
  Filled 2024-05-12: qty 100

## 2024-05-12 MED ORDER — SCOPOLAMINE 1 MG/3DAYS TD PT72
1.0000 | MEDICATED_PATCH | TRANSDERMAL | Status: DC
  Administered 2024-05-12: 1.5 mg via TRANSDERMAL

## 2024-05-12 MED ORDER — FENTANYL CITRATE (PF) 100 MCG/2ML IJ SOLN
INTRAMUSCULAR | Status: AC
  Filled 2024-05-12: qty 2

## 2024-05-12 MED ORDER — OXYCODONE HCL 5 MG PO TABS: 5.0000 mg | ORAL_TABLET | ORAL | Status: DC | PRN

## 2024-05-12 MED ORDER — PROPOFOL 10 MG/ML IV BOLUS
INTRAVENOUS | Status: AC
  Filled 2024-05-12: qty 20

## 2024-05-12 MED ORDER — PHENYLEPHRINE HCL (PRESSORS) 10 MG/ML IV SOLN
INTRAVENOUS | Status: DC | PRN
  Administered 2024-05-12: 80 ug via INTRAVENOUS
  Administered 2024-05-12: 160 ug via INTRAVENOUS
  Administered 2024-05-12 (×8): 80 ug via INTRAVENOUS

## 2024-05-12 MED ORDER — BUPIVACAINE HCL (PF) 0.25 % IJ SOLN
INTRAMUSCULAR | Status: DC | PRN
  Administered 2024-05-12 (×2): 30 mL via PERINEURAL

## 2024-05-12 MED ORDER — PROPOFOL 10 MG/ML IV BOLUS
INTRAVENOUS | Status: DC | PRN
  Administered 2024-05-12: 50 mg via INTRAVENOUS
  Administered 2024-05-12: 150 mg via INTRAVENOUS

## 2024-05-12 MED ORDER — ENOXAPARIN SODIUM 40 MG/0.4ML IJ SOSY
40.0000 mg | PREFILLED_SYRINGE | INTRAMUSCULAR | Status: DC
  Administered 2024-05-13: 40 mg via SUBCUTANEOUS
  Filled 2024-05-12: qty 0.4

## 2024-05-12 MED ORDER — AMISULPRIDE (ANTIEMETIC) 5 MG/2ML IV SOLN
INTRAVENOUS | Status: AC
Start: 2024-05-12 — End: 2024-05-12
  Filled 2024-05-12: qty 4

## 2024-05-12 MED ORDER — ROCURONIUM BROMIDE 100 MG/10ML IV SOLN
INTRAVENOUS | Status: DC | PRN
  Administered 2024-05-12: 20 mg via INTRAVENOUS
  Administered 2024-05-12: 60 mg via INTRAVENOUS
  Administered 2024-05-12: 20 mg via INTRAVENOUS

## 2024-05-12 MED ORDER — HYDROMORPHONE HCL 1 MG/ML IJ SOLN: 0.2500 mg | INTRAMUSCULAR | Status: DC | PRN

## 2024-05-12 MED ORDER — ACETAMINOPHEN 500 MG PO TABS
ORAL_TABLET | ORAL | Status: AC
  Filled 2024-05-12: qty 2

## 2024-05-12 MED ORDER — MIDAZOLAM HCL 2 MG/2ML IJ SOLN
2.0000 mg | Freq: Once | INTRAMUSCULAR | Status: AC
  Administered 2024-05-12: 2 mg via INTRAVENOUS

## 2024-05-12 MED ORDER — ROCURONIUM BROMIDE 10 MG/ML (PF) SYRINGE
PREFILLED_SYRINGE | INTRAVENOUS | Status: AC
  Filled 2024-05-12: qty 10

## 2024-05-12 MED ORDER — TRANEXAMIC ACID 1000 MG/10ML IV SOLN
Status: DC | PRN
  Administered 2024-05-12: 3000 mg via TOPICAL

## 2024-05-12 MED ORDER — ONDANSETRON 4 MG PO TBDP: 4.0000 mg | ORAL_TABLET | Freq: Four times a day (QID) | ORAL | Status: DC | PRN

## 2024-05-12 MED ORDER — CEFAZOLIN SODIUM-DEXTROSE 2-4 GM/100ML-% IV SOLN
2.0000 g | INTRAVENOUS | Status: AC
  Administered 2024-05-12: 2 g via INTRAVENOUS

## 2024-05-12 MED ORDER — KCL IN DEXTROSE-NACL 20-5-0.45 MEQ/L-%-% IV SOLN
INTRAVENOUS | Status: DC
  Filled 2024-05-12: qty 1000

## 2024-05-12 MED ORDER — TRANEXAMIC ACID 1000 MG/10ML IV SOLN
INTRAVENOUS | Status: AC
Start: 2024-05-12 — End: 2024-05-12
  Filled 2024-05-12: qty 30

## 2024-05-12 MED ORDER — FENTANYL CITRATE (PF) 100 MCG/2ML IJ SOLN
INTRAMUSCULAR | Status: DC | PRN
  Administered 2024-05-12 (×4): 50 ug via INTRAVENOUS

## 2024-05-12 MED ORDER — ALBUMIN HUMAN 5 % IV SOLN: INTRAVENOUS | Status: DC | PRN

## 2024-05-12 MED ORDER — ONDANSETRON HCL 4 MG/2ML IJ SOLN: 4.0000 mg | Freq: Once | INTRAMUSCULAR | Status: DC | PRN

## 2024-05-12 MED ORDER — HYDROMORPHONE HCL 1 MG/ML IJ SOLN: 0.5000 mg | INTRAMUSCULAR | Status: DC | PRN

## 2024-05-12 MED ORDER — METHOCARBAMOL 500 MG PO TABS: 500.0000 mg | ORAL_TABLET | Freq: Four times a day (QID) | ORAL | Status: DC | PRN

## 2024-05-12 MED ORDER — OXYCODONE HCL 5 MG/5ML PO SOLN: 5.0000 mg | Freq: Once | ORAL | Status: DC | PRN

## 2024-05-12 MED ORDER — MEPERIDINE HCL 25 MG/ML IJ SOLN: 6.2500 mg | INTRAMUSCULAR | Status: DC | PRN

## 2024-05-12 MED ORDER — PROPOFOL 500 MG/50ML IV EMUL
INTRAVENOUS | Status: AC
Start: 2024-05-12 — End: 2024-05-12
  Filled 2024-05-12: qty 100

## 2024-05-12 MED ORDER — SUGAMMADEX SODIUM 200 MG/2ML IV SOLN
INTRAVENOUS | Status: DC | PRN
Start: 2024-05-12 — End: 2024-05-12
  Administered 2024-05-12: 200 mg via INTRAVENOUS

## 2024-05-12 MED ORDER — SODIUM CHLORIDE 0.9 % IV SOLN
INTRAVENOUS | Status: AC
  Filled 2024-05-12: qty 10

## 2024-05-12 MED ORDER — GENTAMICIN SULFATE 40 MG/ML IJ SOLN
INTRAMUSCULAR | Status: DC | PRN
  Administered 2024-05-12: 500 mL

## 2024-05-12 MED ORDER — PROPOFOL 500 MG/50ML IV EMUL
INTRAVENOUS | Status: DC | PRN
  Administered 2024-05-12: 100 ug/kg/min via INTRAVENOUS

## 2024-05-12 MED ORDER — DEXAMETHASONE SODIUM PHOSPHATE 10 MG/ML IJ SOLN
INTRAMUSCULAR | Status: AC
  Filled 2024-05-12: qty 1

## 2024-05-12 MED ORDER — LACTATED RINGERS IV SOLN: INTRAVENOUS | Status: DC

## 2024-05-12 MED ORDER — DEXAMETHASONE SODIUM PHOSPHATE 4 MG/ML IJ SOLN
INTRAMUSCULAR | Status: DC | PRN
  Administered 2024-05-12: 10 mg via INTRAVENOUS

## 2024-05-12 MED ORDER — ONDANSETRON HCL 4 MG/2ML IJ SOLN
INTRAMUSCULAR | Status: DC | PRN
  Administered 2024-05-12: 4 mg via INTRAVENOUS

## 2024-05-12 MED ORDER — ACETAMINOPHEN 500 MG PO TABS
1000.0000 mg | ORAL_TABLET | Freq: Once | ORAL | Status: AC
  Administered 2024-05-12: 1000 mg via ORAL

## 2024-05-12 MED ORDER — SUGAMMADEX SODIUM 200 MG/2ML IV SOLN
INTRAVENOUS | Status: AC
  Filled 2024-05-12: qty 2

## 2024-05-12 MED ORDER — ONDANSETRON HCL 4 MG/2ML IJ SOLN
INTRAMUSCULAR | Status: AC
  Filled 2024-05-12: qty 2

## 2024-05-12 MED ORDER — KETOROLAC TROMETHAMINE 30 MG/ML IJ SOLN
30.0000 mg | Freq: Three times a day (TID) | INTRAMUSCULAR | Status: DC
  Administered 2024-05-13: 30 mg via INTRAVENOUS
  Filled 2024-05-12: qty 1

## 2024-05-12 MED ORDER — ALBUMIN HUMAN 5 % IV SOLN
INTRAVENOUS | Status: AC
  Filled 2024-05-12: qty 250

## 2024-05-12 MED ORDER — CEFAZOLIN SODIUM-DEXTROSE 1-4 GM/50ML-% IV SOLN
1.0000 g | Freq: Three times a day (TID) | INTRAVENOUS | Status: DC
  Administered 2024-05-12 (×2): 1 g via INTRAVENOUS
  Filled 2024-05-12 (×2): qty 50

## 2024-05-12 MED ORDER — MAGTRACE LYMPHATIC TRACER
INTRAMUSCULAR | Status: DC | PRN
  Administered 2024-05-12: 2 mL via INTRAMUSCULAR

## 2024-05-12 MED ORDER — LIDOCAINE HCL (CARDIAC) PF 100 MG/5ML IV SOSY
PREFILLED_SYRINGE | INTRAVENOUS | Status: DC | PRN
  Administered 2024-05-12: 100 mg via INTRAVENOUS

## 2024-05-12 MED ORDER — OXYCODONE HCL 5 MG PO TABS: 5.0000 mg | ORAL_TABLET | Freq: Once | ORAL | Status: DC | PRN

## 2024-05-12 SURGICAL SUPPLY — 61 items
ALLOGRAFT PERF 16X20 1.6+/-0.4 (Tissue) IMPLANT
BAG DECANTER FOR FLEXI CONT (MISCELLANEOUS) ×1 IMPLANT
BINDER BREAST LRG (GAUZE/BANDAGES/DRESSINGS) IMPLANT
BINDER BREAST XLRG (GAUZE/BANDAGES/DRESSINGS) IMPLANT
BLADE SURG 10 STRL SS (BLADE) ×4 IMPLANT
BLADE SURG 15 STRL LF DISP TIS (BLADE) ×1 IMPLANT
BNDG GAUZE DERMACEA FLUFF 4 (GAUZE/BANDAGES/DRESSINGS) ×2 IMPLANT
CANISTER SUCT 1200ML W/VALVE (MISCELLANEOUS) ×1 IMPLANT
CHLORAPREP W/TINT 26 (MISCELLANEOUS) ×1 IMPLANT
CLIP APPLIE 11 MED OPEN (CLIP) IMPLANT
COVER BACK TABLE 60X90IN (DRAPES) ×1 IMPLANT
COVER MAYO STAND STRL (DRAPES) ×1 IMPLANT
DERMABOND ADVANCED .7 DNX12 (GAUZE/BANDAGES/DRESSINGS) ×2 IMPLANT
DRAIN CHANNEL 15F RND FF W/TCR (WOUND CARE) ×1 IMPLANT
DRAIN CHANNEL 19F RND (DRAIN) ×1 IMPLANT
DRAPE INCISE IOBAN 66X45 STRL (DRAPES) IMPLANT
DRAPE TOP ARMCOVERS (MISCELLANEOUS) ×1 IMPLANT
DRAPE U-SHAPE 76X120 STRL (DRAPES) ×1 IMPLANT
DRAPE UTILITY XL STRL (DRAPES) ×1 IMPLANT
DRSG TEGADERM 4X10 (GAUZE/BANDAGES/DRESSINGS) ×1 IMPLANT
ELECT COATED BLADE 2.86 ST (ELECTRODE) ×1 IMPLANT
ELECTRODE BLDE 4.0 EZ CLN MEGD (MISCELLANEOUS) ×1 IMPLANT
ELECTRODE REM PT RTRN 9FT ADLT (ELECTROSURGICAL) ×1 IMPLANT
EVACUATOR SILICONE 100CC (DRAIN) ×1 IMPLANT
EXPANDER TISSUE FV FOURTE 400 (Prosthesis & Implant Plastic) IMPLANT
GAUZE PAD ABD 8X10 STRL (GAUZE/BANDAGES/DRESSINGS) ×2 IMPLANT
GLOVE BIO SURGEON STRL SZ 6 (GLOVE) ×1 IMPLANT
GLOVE BIOGEL PI IND STRL 7.0 (GLOVE) IMPLANT
GLOVE BIOGEL PI IND STRL 7.5 (GLOVE) IMPLANT
GLOVE BIOGEL PI IND STRL 8 (GLOVE) ×1 IMPLANT
GLOVE ECLIPSE 8.0 STRL XLNG CF (GLOVE) ×1 IMPLANT
GOWN STRL REUS W/ TWL LRG LVL3 (GOWN DISPOSABLE) ×2 IMPLANT
GOWN STRL REUS W/ TWL XL LVL3 (GOWN DISPOSABLE) ×1 IMPLANT
LIGHT WAVEGUIDE WIDE FLAT (MISCELLANEOUS) IMPLANT
NDL HYPO 25X1 1.5 SAFETY (NEEDLE) ×2 IMPLANT
NDL SAFETY ECLIPSE 18X1.5 (NEEDLE) ×1 IMPLANT
NEEDLE HYPO 25X1 1.5 SAFETY (NEEDLE) ×1 IMPLANT
NS IRRIG 1000ML POUR BTL (IV SOLUTION) IMPLANT
PACK BASIN DAY SURGERY FS (CUSTOM PROCEDURE TRAY) ×1 IMPLANT
PENCIL SMOKE EVACUATOR (MISCELLANEOUS) ×1 IMPLANT
PIN SAFETY STERILE (MISCELLANEOUS) ×1 IMPLANT
SHEET MEDIUM DRAPE 40X70 STRL (DRAPES) ×1 IMPLANT
SLEEVE SCD COMPRESS KNEE MED (STOCKING) ×1 IMPLANT
SLEEVE SURGEON STRL (DRAPES) IMPLANT
SPONGE T-LAP 18X18 ~~LOC~~+RFID (SPONGE) ×2 IMPLANT
STAPLER SKIN PROX WIDE 3.9 (STAPLE) ×1 IMPLANT
SUT CHROMIC 3 0 SH 27 (SUTURE) IMPLANT
SUT ETHILON 2 0 FS 18 (SUTURE) ×1 IMPLANT
SUT MNCRL AB 3-0 PS2 18 (SUTURE) ×1 IMPLANT
SUT MNCRL AB 4-0 PS2 18 (SUTURE) ×1 IMPLANT
SUT SILK 2 0 SH (SUTURE) IMPLANT
SUT STRATAFIX SPIRAL PDS+ 0 30 (SUTURE) IMPLANT
SUT VIC AB 0 CT2 27 (SUTURE) IMPLANT
SUT VIC AB 4-0 PS2 18 (SUTURE) ×1 IMPLANT
SUT VICRYL 3-0 CR8 SH (SUTURE) ×2 IMPLANT
SYR 50ML LL SCALE MARK (SYRINGE) IMPLANT
SYR BULB IRRIG 60ML STRL (SYRINGE) ×1 IMPLANT
TOWEL GREEN STERILE FF (TOWEL DISPOSABLE) ×2 IMPLANT
TUBE CONNECTING 20X1/4 (TUBING) ×1 IMPLANT
UNDERPAD 30X36 HEAVY ABSORB (UNDERPADS AND DIAPERS) ×2 IMPLANT
YANKAUER SUCT BULB TIP NO VENT (SUCTIONS) ×1 IMPLANT

## 2024-05-12 NOTE — Interval H&P Note (Signed)
 History and Physical Interval Note:  05/12/2024 6:44 AM  Jessica Campos  has presented today for surgery, with the diagnosis of DUCTAL CARCINOMA IN SITU RIGHT BREAST.  The various methods of treatment have been discussed with the patient and family. After consideration of risks, benefits and other options for treatment, the patient has consented to  bilateral breast reconstruction with tissue expanders acellular dermis as a surgical intervention.  The patient's history has been reviewed, patient examined, no change in status, stable for surgery.  I have reviewed the patient's chart and labs.  Questions were answered to the patient's satisfaction.     Earlis Morad Tal

## 2024-05-12 NOTE — Anesthesia Procedure Notes (Signed)
 Anesthesia Regional Block: Pectoralis block   Pre-Anesthetic Checklist: , timeout performed,  Correct Patient, Correct Site, Correct Laterality,  Correct Procedure, Correct Position, site marked,  Risks and benefits discussed,  Surgical consent,  Pre-op evaluation,  At surgeon's request and post-op pain management  Laterality: Left and Right  Prep: chloraprep       Needles:  Injection technique: Single-shot  Needle Type: Echogenic Stimulator Needle     Needle Length: 9cm  Needle Gauge: 21     Additional Needles:   Procedures:,,,, ultrasound used (permanent image in chart),,    Narrative:  Start time: 05/12/2024 7:04 AM End time: 05/12/2024 7:15 AM Injection made incrementally with aspirations every 5 mL.  Performed by: Personally  Anesthesiologist: Peggye Delon Brunswick, MD  Additional Notes: Discussed risks and benefits of nerve block including, but not limited to, prolonged and/or permanent nerve injury involving sensory and/or motor function. Monitors were applied and a time-out was performed. The nerve and associated structures were visualized under ultrasound guidance. After negative aspiration, local anesthetic was slowly injected around the nerve. There was no evidence of high pressure during the procedure. There were no paresthesias. VSS remained stable and the patient tolerated the procedure well.

## 2024-05-12 NOTE — Interval H&P Note (Signed)
 History and Physical Interval Note:  05/12/2024 7:06 AM  Jessica Campos  has presented today for surgery, with the diagnosis of DUCTAL CARCINOMA IN SITU RIGHT BREAST.  The various methods of treatment have been discussed with the patient and family. After consideration of risks, benefits and other options for treatment, the patient has consented to  Procedure(s) with comments: MASTECTOMY, NIPPLE SPARING (Bilateral) - BILATERAL NIPPLE SPARING MASTECTOMY GEN w/PEC BLOCK RECONSTRUCTION, BREAST (Bilateral) as a surgical intervention.  The patient's history has been reviewed, patient examined, no change in status, stable for surgery.  I have reviewed the patient's chart and labs.  Questions were answered to the patient's satisfaction.     [name] A Jazleen Robeck

## 2024-05-12 NOTE — Op Note (Signed)
 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 Earlis Ranks MD  Anesthesia: General Endotracheal anesthesia with bilateral pectoral blocks  EBL: 80 cc  Specimen: Left breast tissue and pathology, left breast to pathology.  Left nipple frozen section performed which showed cauterization artifact, right breast to pathology, additional tissue under the right skin flap which contained a biopsy clip which was verified by Faxitron since the initial biopsy clip was not in the mastectomy specimen.  This was at 9:00 and was extremely superficial.  Indications for procedure: The patient is a 43 year old female with right breast DCIS.  She opted for bilateral mastectomies and desire nipple preservation.  She also had concerns about a left breast cancer and by reviewing her family history was felt to be at a higher risk than baseline.  Pros and cons of the treatment of DCIS were reviewed with the patient.  Breast conserving surgery versus mastectomy and reconstruction as well as risk reduction procedures reviewed.  Pros and cons of each approach reviewed.  Complications of surgery were discussed with the patient specially with the nipple sparing technique.  Discussed the potential for nipple loss from the procedure as well as flap necrosis with any mastectomy.  Reviewed the pros and cons of this as well as contrasting into a traditional mastectomy.  Reviewed the risk of mastectomy to include but exclusive of bleeding, infection, skin loss, nipple loss, recurrent surgery, the need for other treatments, DVT, PE, other cardiovascular events, and overall cosmesis.  She has seen plastic surgery.  She has agreed to proceed.  Description of procedure: The patient was seen in the holding area  and questions were answered.  The mammogram was reviewed.  She underwent bilateral pectoral blocks by anesthesia.  All questions were answered.  She was taken back to the operating placed upon the OR table.  After induction of general esthesia she was prepped and draped in a sterile fashion.  A Foley catheter was placed.  Timeout performed.  2 cc of mag trace were injected into the right breast.  The left mastectomy was performed first.  An inframammary incision was made.  Dissection was carried down to the fascia of the pectoralis major muscle.  The posterior aspect of the breast was then dissected off the pectoralis muscle superiorly.  Once the breast was disconnected the anterior skin flap was created.  Of note she had extremely dense breast tissue.  The dissection was extremely difficult since her tissue planes were not well-defined due to extreme fibrocystic change in the breast.  Cautery was used to try to mobilize the skin off the breast tissue.  This was done slowly.  At the nipple though it got quite thin and fibrotic.  We were able to dissect this down to the skin was quite thin at this juncture.  This was just inferior to the nipple areolar complex.  I was able to perform a biopsy of the left nipple.  This showed significant cauterization.  Nipple itself was normal. Carefully dissected the breast but there is extremely dense tissue making dissection quite difficult and the tissue planes were difficult to separate.  We carefully dissected more laterally up toward the clavicle.  And then became more medially at that point.  There is a small area of significant dense breast tissue in the left upper  inner quadrant.  There is small area where the skin was reached here trying to dissect tissue planes apart.  This was about 3 mm.  We were able to continue dissection carefully trying to separate complaints but these planes were extremely dense and difficult to dissect through.  We are able to get to the superior  aspect of the breast and then amputated the breast from the chest wall carefully with cautery.  The tissue was oriented with a single stitch marking superior.  Examination of the left breast showed an area of about 1 cm at the  inferior aspect of the nipple areolar complex.  Extremely thin.  There is also some cauterization artifact there as well but the skin was left intact.  I then examined the wound for bleeding and this was controlled with cautery.  At this point opted to place a TXA soaked sponge on this side to help facilitate hemostasis.  Agreed to have used to cover.  The right side was done.  In a similar fashion an inframammary incision was made.  Dissection was carried down to the pectoralis fashion.  The posterior aspect of the breast was dissected off the pectoralis fascia.  Once this was done this.  Skin flaps were created.  Cautery was used as well as sharp dissection to facilitate due to the extremely fibrotic nature of her breast and difficulty discerning tissue planes.  This was done up to the level of the nipple.  Nipple was then separated from the breast tissue.  Biopsy was taken and sent to pathology which was felt to be benign.  We then carried dissection laterally over the area of DCIS.  It was taken up to the axilla.  We then worked medially using sharp dissection and cautery carefully.  The tissue was quite dense.  This was taken all the way to the sternum.  This was then taken around superiorly and the breast was amputated.  The breast was oriented to single stitch superior.  I then used the Faxitron images.  There is no biopsy clip in the initial mastectomy specimen.  I then reexamined the mammogram.  We reexamined the skin over the upper outer quadrant where the usual biopsy was.  Tissue is in scraped off the backside of the skin using sharp dissection with care not to violate or devascularize the skin the best we could.  The Faxitron revealed the clip to be in the tissue that we  removed in the subcutaneous skin in the right upper outer quadrant.  Once this was removed we reexamined for hemostasis and this was excellent in the right side.  The skin appears intact with no obvious ischemic changes noted.  Reexamination revealed good hemostasis.  At this point of the case, Dr. Arelia took over to do the reconstruction portion.  Please see her op note for other details.  The patient was hemodynamically stable and I scrubbed out.  All counts were correct.

## 2024-05-12 NOTE — Anesthesia Postprocedure Evaluation (Signed)
 Anesthesia Post Note  Patient: Jessica Campos  Procedure(s) Performed: RIGHT NIPPLE SPARING MASTECYOMY, LEFT RISK REDUCING NIPPLE SPARING MASTECTOMY (Bilateral: Breast) BILTERAL BREAST RECONSTRUCTION WITH TISSUE EXPANDERS AND ACELLULAR DERMIS (Bilateral: Breast)     Patient location during evaluation: PACU Anesthesia Type: Regional and General Level of consciousness: awake and alert, oriented and patient cooperative Pain management: pain level controlled Vital Signs Assessment: post-procedure vital signs reviewed and stable Respiratory status: spontaneous breathing, nonlabored ventilation and respiratory function stable Cardiovascular status: blood pressure returned to baseline and stable Postop Assessment: no apparent nausea or vomiting Anesthetic complications: no   No notable events documented.  Last Vitals:  Vitals:   05/12/24 1245 05/12/24 1300  BP: 113/72 114/73  Pulse: 74 69  Resp: (!) 23 19  Temp:    SpO2: 98% 98%    Last Pain:  Vitals:   05/12/24 1245  TempSrc:   PainSc: 0-No pain                 [name]

## 2024-05-12 NOTE — Op Note (Signed)
 Operative Note   DATE OF OPERATION: 7.8.2025  LOCATION: [facility]-observation  SURGICAL DIVISION: Plastic Surgery  PREOPERATIVE DIAGNOSES:  1. DCIS right breast  POSTOPERATIVE DIAGNOSES:  same  PROCEDURE:  1. Bilateral breast reconstruction with tissue expanders 2. Acellular dermis (Alloderm) 600 cm2 to bilateral chest  SURGEON: Earlis Ranks MD MBA  ASSISTANT: none  ANESTHESIA:  General.   EBL: 100 ml for entire procedure  COMPLICATIONS: None immediate.   INDICATIONS FOR PROCEDURE:  The patient, Jessica Campos, is a 43 y.o. female born on 09-02-1981, is here for immediate prepectoral tissue expander based reconstruction following nipple sparing mastectomies   FINDINGS: Natrelle 133S-FV-12 T 400 ml tissue expanders placed bilateral initial fill volume 180 ml air bilateral. RIGHT SN [id number] LEFT SN [id number]  DESCRIPTION OF PROCEDURE:  The patient was marked with the patient in the preoperative area to mark sternal notch, chest midline, anterior axillary lines and inframammary folds. Following induction, Foley catheter placed. The patient's operative site was prepped and draped in a sterile fashion. A time out was performed and all information was confirmed to be correct. I assisted in mastectomies in exposure retraction and dissection. Following completion of mastectomies, reconstruction began on the left side. Sharp excision of two area that had incurred full thickness injury to mastectomy flap, over over upper inner quadrant, and one inferior to areola both excised sharply with knife. Each area closed primarily with interrupted 3-0 monocryl in dermis and running locking skin closure with 4-0 monocryl. Hemostasis ensured. Cavity irrigated with saline followed by saline solution containing Ancef , gentamicin , and Betadine. A 19 Fr drain was placed in subcutaneous position laterally and a 15 Fr drain placed along inframammary fold. Each secured to skin with 2-0 nylon. The tissue  expanders were prepared on back table prior in insertion. The expander was filled with air. Perforated acellular dermis was draped over anterior surface expander. The ADM was then secured to itself over posterior surface of expander with 4-0 chromic. Redundant folds acellular dermis excised so that the ADM lay flat without folds over air filled expander. The expander was secured to fascia over lateral sternal border with a 0 vicryl. The lateral tab was also secured to pectoralis muscle with 0-vicryl. The ADM was secured to pectoralis muscle and chest wall along inferior border at inframammary fold with 0 Stattafix suture. Laterally the mastectomy flap over posterior axillary line was advanced anteriorly and the subcutaneous tissue and superficial fascia was secured to pectoralis and serratus muscles with 0-vicryl. Skin closure completed with 3-0 vicryl in fascial layer and 4-0 vicryl in dermis. Skin closure completed with 4-0 monocryl subcuticular and tissue adhesive.   I then directed my attention to right chest where similar irrigation and drain placement completed. The prepared expander with ADM secured over anterior surface was placed in left chest and tabs secured to chest wall and pectoralis muscle with 0- vicryl suture. The acellular dermis at inframammary fold was secured to chest wall with 0 Stattafix  suture. Laterally the mastectomy flap over posterior axillary line was advanced anteriorly and the subcutaneous tissue and superficial fascia was secured to pectoralis and serratus muscles with 0-vicryl. Skin closure completed with 3-0 vicryl in fascial layer and 4-0 vicryl in dermis. Skin closure completed with 4-0 monocryl subcuticular. The patient was brought to upright sitting position. The mastectomy flaps were redraped so that NAC was symmetric from sternal notch and chest midline. Patient returned to supine position. Tissue adhesive applied over all incisions. Tegaderm dressings applied followed by  dry dressing, breast binder.   The patient was allowed to wake from anesthesia, extubated and taken to the recovery room in satisfactory condition.   SPECIMENS: none  DRAINS: 15 and Fr JP in right and left subcutaneous breast  Earlis Ranks, MD [facility] Plastic & Reconstructive Surgery  Office/ physician access line after hours [phone]

## 2024-05-12 NOTE — Progress Notes (Signed)
Assisted Dr. Neoma Laming with left, right, pectoralis, ultrasound guided block. Side rails up, monitors on throughout procedure. See vital signs in flow sheet. Tolerated Procedure well.

## 2024-05-12 NOTE — Anesthesia Procedure Notes (Signed)
 Procedure Name: Intubation Date/Time: 05/12/2024 7:37 AM  Performed by: Pam Macario BROCKS, CRNAPre-anesthesia Checklist: Patient identified, Emergency Drugs available, Suction available, Patient being monitored and Timeout performed Patient Re-evaluated:Patient Re-evaluated prior to induction Oxygen Delivery Method: Circle system utilized Preoxygenation: Pre-oxygenation with 100% oxygen Induction Type: IV induction Ventilation: Mask ventilation without difficulty Laryngoscope Size: Mac and 3 Grade View: Grade II Tube type: Oral Tube size: 7.0 mm Number of attempts: 1 Airway Equipment and Method: Stylet and Oral airway Placement Confirmation: ETT inserted through vocal cords under direct vision, positive ETCO2, breath sounds checked- equal and bilateral and CO2 detector Secured at: 24 cm Tube secured with: Tape Dental Injury: Teeth and Oropharynx as per pre-operative assessment

## 2024-05-12 NOTE — Transfer of Care (Signed)
 Immediate Anesthesia Transfer of Care Note  Patient: Jessica Campos  Procedure(s) Performed: RIGHT NIPPLE SPARING MASTECYOMY, LEFT RISK REDUCING NIPPLE SPARING MASTECTOMY (Bilateral: Breast) BILTERAL BREAST RECONSTRUCTION WITH TISSUE EXPANDERS AND ACELLULAR DERMIS (Bilateral: Breast)  Patient Location: PACU  Anesthesia Type:General  Level of Consciousness: awake and sedated  Airway & Oxygen Therapy: Patient Spontanous Breathing and Patient connected to nasal cannula oxygen  Post-op Assessment: Report given to RN  Post vital signs: Reviewed and stable  Last Vitals:  Vitals Value Taken Time  BP 119/77 05/12/24 12:15  Temp    Pulse 82 05/12/24 12:16  Resp 20 05/12/24 12:16  SpO2 100 % 05/12/24 12:16  Vitals shown include unfiled device data.  Last Pain:  Vitals:   05/12/24 0650  TempSrc: Temporal  PainSc: 0-No pain         Complications: No notable events documented.

## 2024-05-13 ENCOUNTER — Encounter (HOSPITAL_BASED_OUTPATIENT_CLINIC_OR_DEPARTMENT_OTHER): Payer: Self-pay | Admitting: Surgery

## 2024-05-13 DIAGNOSIS — D0511 Intraductal carcinoma in situ of right breast: Secondary | ICD-10-CM | POA: Diagnosis not present

## 2024-05-13 MED ORDER — IBUPROFEN 800 MG PO TABS: 800.0000 mg | ORAL_TABLET | Freq: Three times a day (TID) | ORAL | 0 refills | Status: DC | PRN

## 2024-05-13 NOTE — Discharge Summary (Signed)
 Physician Discharge Summary  Patient ID: Jessica Campos MRN: [id number] DOB/AGE: 1981-03-21 43 y.o.  Admit date: 05/12/2024 Discharge date: 05/13/2024  Admission Diagnoses:DCIS right breast   Discharge Diagnoses:  Principal Problem:   DCIS (ductal carcinoma in situ) of breast   Discharged Condition: good  Hospital Course: pt  did well  post op. She had good pain control, tolerated diet.    Treatments: surgery: B NSM   Discharge Exam: Blood pressure 127/70, pulse 65, temperature 98.3 F (36.8 C), resp. rate 18, height 5' 2 (1.575 m), weight 71.4 kg, last menstrual period 08/05/2018, SpO2 100%. Incision/Wound:wounds CDI/ NO HEMATOMA. SKIN FLAPS VIABLE   Disposition: Discharge disposition: 01-Home or Self Care       Discharge Instructions     Call MD for:  redness, tenderness, or signs of infection (pain, swelling, bleeding, redness, odor or green/yellow discharge around incision site)   Complete by: As directed    Call MD for:  temperature >100.5   Complete by: As directed    Diet - low sodium heart healthy   Complete by: As directed    Discharge instructions   Complete by: As directed    Ok to remove dressings and shower am 7.10.2025. Soap and water  ok, pat Tegaderms dry. Do not remove Tegaderms. No creams or ointments over incisions. Do not let drains dangle in shower, attach to lanyard or similar.Strip and record drains twice daily and bring log to clinic visit.  Breast binder or soft compression bra all other times.  Ok to raise arms above shoulders for bathing and dressing.  No house yard work or exercise until cleared by MD.   Patient received all Rx preop. Recommend Miralax or Dulcolax for constipation. Recommend ibuprofen  with meals to aid with pain control. Also ok to use Tylenol  for pain. Ice packs to chest for comfort.   Driving Restrictions   Complete by: As directed    No driving for 2 weeks then no driving if taking prescription pain medication    Increase activity slowly   Complete by: As directed    Lifting restrictions   Complete by: As directed    No lifting > 5-10 lbs until cleared by MD   Resume previous diet   Complete by: As directed       Allergies as of 05/13/2024   No Known Allergies      Medication List     TAKE these medications    Boric Acid 600 MG Supp   cetirizine 10 MG tablet Commonly known as: ZYRTEC Take 10 mg by mouth daily.   Fish Oil 1000 MG Caps   ibuprofen  800 MG tablet Commonly known as: ADVIL  Take 1 tablet (800 mg total) by mouth every 8 (eight) hours as needed.   minoxidil 2.5 MG tablet Commonly known as: LONITEN Take 2.5 mg by mouth daily.   Probiotic 1-250 BILLION-MG Caps Take by mouth.   Red Yeast Rice Extract 600 MG Caps   scopolamine  1 MG/3DAYS Commonly known as: TRANSDERM-SCOP Place 1 patch (1.5 mg total) onto the skin every 3 (three) days.        Follow-up Information     Arelia Filippo, MD Follow up in 1 week(s).   Specialty: Plastic Surgery Why: as scheduled Contact information: [address] [place] [address] [phone]         Vanderbilt Ned, MD Follow up.   Specialty: General Surgery Why: as scheduled Contact information: [address]  [place] [address] [phone]                 Signed: Debby LABOR Ayva Veilleux 05/13/2024, 7:33 AM

## 2024-05-13 NOTE — Discharge Instructions (Signed)
 About my Jackson-Pratt Bulb Drain  What is a Jackson-Pratt bulb? A Jackson-Pratt is a soft, round device used to collect drainage. It is connected to a long, thin drainage catheter, which is held in place by one or two small stiches near your surgical incision site. When the bulb is squeezed, it forms a vacuum, forcing the drainage to empty into the bulb.  Emptying the Jackson-Pratt bulb- To empty the bulb: 1. Release the plug on the top of the bulb. 2. Pour the bulb's contents into a measuring container which your nurse will provide. 3. Record the time emptied and amount of drainage. Empty the drain(s) as often as your     doctor or nurse recommends.  Date                  Time                    Amount (Drain 1)                 Amount (Drain 2)  _____________________________________________________________________  _____________________________________________________________________  _____________________________________________________________________  _____________________________________________________________________  _____________________________________________________________________  _____________________________________________________________________  _____________________________________________________________________  _____________________________________________________________________  Squeezing the Jackson-Pratt Bulb- To squeeze the bulb: 1. Make sure the plug at the top of the bulb is open. 2. Squeeze the bulb tightly in your fist. You will hear air squeezing from the bulb. 3. Replace the plug while the bulb is squeezed. 4. Use a safety pin to attach the bulb to your clothing. This will keep the catheter from     pulling at the bulb insertion site.  When to call your doctor- Call your doctor if:  Drain site becomes red, swollen or hot.  You have a fever greater than 101 degrees F.  There is oozing at the drain site.  Drain falls out (apply a guaze  bandage over the drain hole and secure it with tape).  Drainage increases daily not related to activity patterns. (You will usually have more drainage when you are active than when you are resting.)  Drainage has a bad odor.

## 2024-05-15 LAB — SURGICAL PATHOLOGY

## 2024-05-18 ENCOUNTER — Encounter: Payer: Self-pay | Admitting: *Deleted

## 2024-05-18 ENCOUNTER — Ambulatory Visit: Payer: Self-pay | Admitting: Surgery

## 2024-06-01 ENCOUNTER — Telehealth: Payer: Self-pay

## 2024-06-01 NOTE — Telephone Encounter (Signed)
 Left message to confirm appt for 7/29

## 2024-06-02 ENCOUNTER — Inpatient Hospital Stay: Attending: Hematology and Oncology | Admitting: Hematology and Oncology

## 2024-06-02 ENCOUNTER — Encounter: Payer: Self-pay | Admitting: *Deleted

## 2024-06-02 VITALS — BP 133/90 | HR 87 | Temp 98.7°F | Resp 17 | Wt 162.7 lb

## 2024-06-02 DIAGNOSIS — D0511 Intraductal carcinoma in situ of right breast: Secondary | ICD-10-CM

## 2024-06-02 DIAGNOSIS — I10 Essential (primary) hypertension: Secondary | ICD-10-CM | POA: Diagnosis not present

## 2024-06-02 DIAGNOSIS — Z8042 Family history of malignant neoplasm of prostate: Secondary | ICD-10-CM | POA: Diagnosis not present

## 2024-06-02 DIAGNOSIS — Z9013 Acquired absence of bilateral breasts and nipples: Secondary | ICD-10-CM | POA: Diagnosis not present

## 2024-06-02 DIAGNOSIS — Z803 Family history of malignant neoplasm of breast: Secondary | ICD-10-CM | POA: Insufficient documentation

## 2024-06-02 DIAGNOSIS — Z8249 Family history of ischemic heart disease and other diseases of the circulatory system: Secondary | ICD-10-CM | POA: Insufficient documentation

## 2024-06-02 DIAGNOSIS — Z1721 Progesterone receptor positive status: Secondary | ICD-10-CM | POA: Insufficient documentation

## 2024-06-02 DIAGNOSIS — Z170 Estrogen receptor positive status [ER+]: Secondary | ICD-10-CM | POA: Insufficient documentation

## 2024-06-02 NOTE — Assessment & Plan Note (Addendum)
 Assessment and Plan Assessment & Plan Ductal carcinoma in situ of breast, status post bilateral mastectomy Grade 2 DCIS, post-bilateral mastectomy with clear margins, no invasive cancer. No tamoxifen due to stroke risk and family history of heart disease. [facility] nomogram shows no benefit from tamoxifen. - No tamoxifen therapy required. - No mammograms needed due to absence of breast tissue.  Breast reconstruction with implants, post-mastectomy Breast reconstruction with implants planned, managed by Dr. Donita. - Proceed with breast reconstruction with implants under Dr. Donita.  Essential hypertension Blood pressure well-controlled.  Lifestyle and diet recommendations Emphasized plant-based diet, lean meats, mental health, sleep, stress management, and exercise to reduce recurrence risk. - Adopt a plant-based diet with lean meats, avoiding red meat. - Engage in 150 minutes of active exercise weekly. - Focus on mental health, adequate sleep, and stress management.

## 2024-06-02 NOTE — Progress Notes (Signed)
 [facility] CONSULT NOTE  Patient Care Team: Jessica Campos, Tully GRADE, MD as PCP - General (Internal Medicine) Tyree Nanetta SAILOR, RN as Oncology Nurse Navigator Glean, Stephane BROCKS, RN (Inactive) as Oncology Nurse Navigator Vanderbilt Ned, MD as Consulting Physician (General Surgery) Loretha Ash, MD as Consulting Physician (Hematology and Oncology) Shannon Agent, MD as Consulting Physician (Radiation Oncology)  CHIEF COMPLAINTS/PURPOSE OF CONSULTATION:  Newly diagnosed breast cancer  HISTORY OF PRESENTING ILLNESS:  Jessica Campos Medico 43 y.o. female is here because of recent diagnosis of right breast IDC  I reviewed her records extensively and collaborated the history with the patient.  SUMMARY OF ONCOLOGIC HISTORY: Oncology History  Ductal carcinoma in situ (DCIS) of right breast  03/02/2024 Initial Diagnosis   Ductal carcinoma in situ (DCIS) of right breast   03/04/2024 Cancer Staging   Staging form: Breast, AJCC 8th Edition - Clinical stage from 03/04/2024: Stage 0 (cTis (DCIS), cN0, cM0, ER+, PR+) - Signed by Loretha Ash, MD on 03/04/2024 Stage prefix: Initial diagnosis Nuclear grade: G2    Genetic Testing   [facility] CancerNext-Expanded Panel+RNA was Negative. 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 Report date is 03/16/2024.  The CancerNext-Expanded gene panel offered by [facility] and includes sequencing, rearrangement, and RNA analysis for the following 77 genes: AIP, ALK, APC, ATM, AXIN2, BAP1, BARD1, BMPR1A, BRCA1, BRCA2, BRIP1, CDC73, CDH1, CDK4, CDKN1B, CDKN2A, CEBPA, CHEK2, CTNNA1, DDX41, DICER1, ETV6, FH, FLCN, GATA2, LZTR1, MAX, MBD4, MEN1, MET, MLH1, MSH2, MSH3, MSH6, MUTYH, NF1, NF2, NTHL1, PALB2, PHOX2B, PMS2, POT1, PRKAR1A, PTCH1, PTEN, RAD51C, RAD51D, RB1, RET, RPS20, RUNX1, SDHA, SDHAF2, SDHB, SDHC, SDHD, SMAD4, SMARCA4, SMARCB1, SMARCE1, STK11, SUFU, TMEM127, TP53, TSC1, TSC2, VHL, and WT1 (sequencing  and deletion/duplication); EGFR, HOXB13, KIT, MITF, PDGFRA, POLD1, and POLE (sequencing only); EPCAM and GREM1 (deletion/duplication only).      Discussed the use of AI scribe software for clinical note transcription with the patient, who gave verbal consent to proceed.  History of Present Illness  Jessica Campos is a 43 year old female with ductal carcinoma in situ (DCIS) who presents for follow-up after bilateral mastectomy.  She was diagnosed with ductal carcinoma in situ (DCIS) at the age of 89, confirmed by a core biopsy on February 25, 2024. She underwent a bilateral mastectomy on May 12, 2024. The DCIS was grade 2, and all surgical margins were clear with no invasive cancer detected.   She recently returned from a vacation, a cruise to the [place] and [address], which she described as enjoyable and relaxing.  No new symptoms reported. Blood pressure is well-controlled.  MEDICAL HISTORY:  Past Medical History:  Diagnosis Date   Ankle fracture 2005   Right   Anxiety    Breast cancer (HCC)    Elevated blood pressure reading    after second c section, resolved on its own, not currently on BP medications   Fibroids    High cholesterol    Joint pain    Obesity (BMI 30-39.9)    PONV (postoperative nausea and vomiting)    Seasonal allergies    Vitamin D  deficiency     SURGICAL HISTORY: Past Surgical History:  Procedure Laterality Date   ANKLE SURGERY Right 2005   BREAST BIOPSY Right 02/25/2024   US  RT BREAST BX W LOC DEV 1ST LESION IMG BX SPEC US  GUIDE 02/25/2024 [facility] MAMMOGRAPHY   BREAST RECONSTRUCTION Bilateral 05/12/2024   Procedure: BILTERAL BREAST RECONSTRUCTION WITH TISSUE EXPANDERS AND  ACELLULAR DERMIS;  Surgeon: Arelia Filippo, MD;  Location: [facility];  Service: Plastics;  Laterality: Bilateral;   CESAREAN SECTION     x2   FOOT SURGERY Right 2013   NIPPLE SPARING MASTECTOMY Bilateral 05/12/2024   Procedure: RIGHT NIPPLE SPARING MASTECYOMY, LEFT  RISK REDUCING NIPPLE SPARING MASTECTOMY;  Surgeon: Vanderbilt Ned, MD;  Location: [facility];  Service: General;  Laterality: Bilateral;  BILATERAL NIPPLE SPARING MASTECTOMY GEN w/PEC BLOCK   PARTIAL HYSTERECTOMY      SOCIAL HISTORY: Social History   Socioeconomic History   Marital status: Married    Spouse name: Prentice   Number of children: Not on file   Years of education: Not on file   Highest education level: Not on file  Occupational History   Not on file  Tobacco Use   Smoking status: Never   Smokeless tobacco: Never  Vaping Use   Vaping status: Never Used  Substance and Sexual Activity   Alcohol use: Yes    Comment: occasional   Drug use: Never   Sexual activity: Yes  Other Topics Concern   Not on file  Social History Narrative   Not on file   Social Drivers of Health   Financial Resource Strain: Not on file  Food Insecurity: No Food Insecurity (03/04/2024)   Hunger Vital Sign    Worried About Running Out of Food in the Last Year: Never true    Ran Out of Food in the Last Year: Never true  Transportation Needs: No Transportation Needs (03/04/2024)   PRAPARE - Administrator, Civil Service (Medical): No    Lack of Transportation (Non-Medical): No  Physical Activity: Not on file  Stress: Not on file  Social Connections: Unknown (06/16/2023)   [facility]   Social Network    Social Network: Not on file  Intimate Partner Violence: Not At Risk (03/04/2024)   Humiliation, Afraid, Rape, and Kick questionnaire    Fear of Current or Ex-Partner: No    Emotionally Abused: No    Physically Abused: No    Sexually Abused: No    FAMILY HISTORY: Family History  Problem Relation Age of Onset   Hyperlipidemia Mother    Hypertension Mother    Obesity Mother    Obesity Father    Stroke Father    Hyperlipidemia Father    Diabetes Father    Hypertension Father    Diabetes type II Father    Alcoholism Father    Prostate  cancer Maternal Uncle 30   Prostate cancer Maternal Uncle 63       metastatic   Breast cancer Paternal Aunt 50       recurred at 51   Breast cancer Other 80       maternal great aunt    ALLERGIES:  has no known allergies.  MEDICATIONS:  Current Outpatient Medications  Medication Sig Dispense Refill   Bacillus Coagulans-Inulin (PROBIOTIC) 1-250 BILLION-MG CAPS Take by mouth.     Boric Acid 600 MG SUPP      cetirizine (ZYRTEC) 10 MG tablet Take 10 mg by mouth daily.     ibuprofen  (ADVIL ) 800 MG tablet Take 1 tablet (800 mg total) by mouth every 8 (eight) hours as needed. 30 tablet 0   minoxidil (LONITEN) 2.5 MG tablet Take 2.5 mg by mouth daily.     Omega-3 Fatty Acids (FISH OIL) 1000 MG CAPS      Red Yeast Rice Extract 600 MG  CAPS      scopolamine  (TRANSDERM-SCOP) 1 MG/3DAYS Place 1 patch (1.5 mg total) onto the skin every 3 (three) days. 10 patch 0   No current facility-administered medications for this visit.    PHYSICAL EXAMINATION: ECOG PERFORMANCE STATUS: 0 - Asymptomatic  Vitals:   06/02/24 1114  BP: (!) 133/90  Pulse: 87  Resp: 17  Temp: 98.7 F (37.1 C)  SpO2: 100%    Filed Weights   06/02/24 1114  Weight: 162 lb 11.2 oz (73.8 kg)   PE deferred in lieu of counseling  LABORATORY DATA:  I have reviewed the data as listed Lab Results  Component Value Date   WBC 5.9 03/04/2024   HGB 13.6 03/04/2024   HCT 39.4 03/04/2024   MCV 88.1 03/04/2024   PLT 364 03/04/2024   Lab Results  Component Value Date   NA 137 03/04/2024   K 3.7 03/04/2024   CL 103 03/04/2024   CO2 26 03/04/2024    RADIOGRAPHIC STUDIES: I have personally reviewed the radiological reports and agreed with the findings in the report.  ASSESSMENT AND PLAN:  Ductal carcinoma in situ (DCIS) of right breast Assessment and Plan Assessment & Plan Ductal carcinoma in situ of breast, status post bilateral mastectomy Grade 2 DCIS, post-bilateral mastectomy with clear margins, no invasive  cancer. No tamoxifen due to stroke risk and family history of heart disease. [facility] nomogram shows no benefit from tamoxifen. - No tamoxifen therapy required. - No mammograms needed due to absence of breast tissue.  Breast reconstruction with implants, post-mastectomy Breast reconstruction with implants planned, managed by Dr. Donita. - Proceed with breast reconstruction with implants under Dr. Donita.  Essential hypertension Blood pressure well-controlled.  Lifestyle and diet recommendations Emphasized plant-based diet, lean meats, mental health, sleep, stress management, and exercise to reduce recurrence risk. - Adopt a plant-based diet with lean meats, avoiding red meat. - Engage in 150 minutes of active exercise weekly. - Focus on mental health, adequate sleep, and stress management.     All questions were answered. The patient knows to call the clinic with any problems, questions or concerns.    Amber Stalls, MD 06/02/24

## 2024-06-16 ENCOUNTER — Ambulatory Visit: Admitting: Family Medicine

## 2024-06-23 ENCOUNTER — Ambulatory Visit: Admitting: Family Medicine

## 2024-06-23 ENCOUNTER — Encounter: Payer: Self-pay | Admitting: Family Medicine

## 2024-06-23 VITALS — BP 131/84 | HR 72 | Temp 98.0°F | Ht 62.0 in | Wt 160.0 lb

## 2024-06-23 DIAGNOSIS — E785 Hyperlipidemia, unspecified: Secondary | ICD-10-CM | POA: Diagnosis not present

## 2024-06-23 DIAGNOSIS — E663 Overweight: Secondary | ICD-10-CM | POA: Diagnosis not present

## 2024-06-23 DIAGNOSIS — Z6829 Body mass index (BMI) 29.0-29.9, adult: Secondary | ICD-10-CM

## 2024-06-23 DIAGNOSIS — D0511 Intraductal carcinoma in situ of right breast: Secondary | ICD-10-CM

## 2024-06-23 DIAGNOSIS — R948 Abnormal results of function studies of other organs and systems: Secondary | ICD-10-CM | POA: Diagnosis not present

## 2024-06-23 MED ORDER — LOMAIRA 8 MG PO TABS: ORAL_TABLET | ORAL | 0 refills | Status: DC

## 2024-06-23 NOTE — Progress Notes (Signed)
 Office: [phone]  /  Fax: [phone]  WEIGHT SUMMARY AND BIOMETRICS  Starting Date: 01/20/24  Starting Weight: 163lb   Weight Lost Since Last Visit: 0lb   Vitals Temp: 98 F (36.7 C) BP: 131/84 Pulse Rate: 72 SpO2: 100 %   Body Composition  Body Fat %: 33.9 % Fat Mass (lbs): 54.4 lbs Muscle Mass (lbs): 100.8 lbs Total Body Water  (lbs): 72.4 lbs Visceral Fat Rating : 7     HPI  Chief Complaint: OBESITY  Jessica Campos is here to discuss her progress with her obesity treatment plan. She is on the the Category 3 Plan and states she is following her eating plan approximately 40 % of the time. She states she is exercising 20 minutes 2-3 times per week.   Interval History:  Since last office visit she is up 3 lb This gives her a net weight loss of 3 lb in the past 5 months and medically supervised weight management Works as a Publishing copy (sedentary) She has craved more junk food (sweets) She had bilateral mastectomy done with Dr Vanderbilt 7/8 for DCIS She does not need additional treatment for DCIS, and seen by Dr. Armstead She is working out with use of her Peloton but her endurance and strength have reduced Her family has been supportive She would like to lose about 15 more pounds She never used antiobesity medications  Pharmacotherapy: None  PHYSICAL EXAM:  Blood pressure 131/84, pulse 72, temperature 98 F (36.7 C), height 5' 2 (1.575 m), weight 160 lb (72.6 kg), last menstrual period 08/05/2018, SpO2 100%. Body mass index is 29.26 kg/m.  General: She is healthy appearing, cooperative, alert, well developed, and in no acute distress. PSYCH: Has normal mood, affect and thought process.   Lungs: Normal breathing effort, no conversational dyspnea.   ASSESSMENT AND PLAN  TREATMENT PLAN FOR OBESITY:  Recommended Dietary Goals  Jessica Campos is currently in the action stage of change. As such, her goal is to continue weight management plan. She has agreed to  the Category 3 Plan. Copy of category 3 meal plan provided Keep lunch calories from 400 to include 30 g of protein  Behavioral Intervention  We discussed the following Behavioral Modification Strategies today: increasing lean protein intake to established goals, increasing fiber rich foods, increasing water  intake , work on meal planning and preparation, keeping healthy foods at home, work on managing stress, creating time for self-care and relaxation, avoiding temptations and identifying enticing environmental cues, planning for success, and continue to work on maintaining a reduced calorie state, getting the recommended amount of protein, incorporating whole foods, making healthy choices, staying well hydrated and practicing mindfulness when eating..  Additional resources provided today: NA  Recommended Physical Activity Goals  Cing has been advised to work up to 150 minutes of moderate intensity aerobic activity a week and strengthening exercises 2-3 times per week for cardiovascular health, weight loss maintenance and preservation of muscle mass.   She has agreed to Increase the intensity, frequency or duration of strengthening exercises  and Increase the intensity, frequency or duration of aerobic exercises   Slowly ramp up exercise on body heals from recent surgery  Pharmacotherapy changes for the treatment of obesity: Begin Lomaira  8 mg tab twice daily PDMP reviewed Blood pressure and heart rate are within normal limits She has had a hysterectomy for birth control Informed consent signed  ASSOCIATED CONDITIONS ADDRESSED TODAY  Hyperlipidemia, unspecified hyperlipidemia type Lab Results  Component Value Date   CHOL 174 03/13/2024  HDL 60.50 03/13/2024   LDLCALC 105 (H) 03/13/2024   TRIG 43.0 03/13/2024   CHOLHDL 3 03/13/2024  Continue to work on a low saturated fat/low sugar diet -     Lomaira ; 1 tab po 30 min before breakfast daily and 1 tab po at 2 pm daily  Dispense: 60  tablet; Refill: 0  Overweight (BMI 25.0-29.9) Given her lack of weight loss despite efforts, she is a good candidate for the addition of Lomaira  8 mg tab twice daily.  We discussed mechanism of action and potential adverse side effects.  Continue category 3 meal plan, ramping up exercise as tolerated -     Lomaira ; 1 tab po 30 min before breakfast daily and 1 tab po at 2 pm daily  Dispense: 60 tablet; Refill: 0  Ductal carcinoma in situ (DCIS) of right breast Keep upcoming visits with Dr. Vanderbilt and plastic surgery She has been cleared for exercise after bilateral mastectomy with reconstructive surgery  Low basal metabolic rate Initial fasting IC was quite low at 1296 cal/day. She is working on improving protein intake with meals, sleep at night (struggling) and regular exercise to include both cardio and resistance training on a regular basis Plan to repeat fasting IC in the next 2 to 3 months   She was informed of the importance of frequent follow up visits to maximize her success with intensive lifestyle modifications for her multiple health conditions.   ATTESTASTION STATEMENTS:  Reviewed by clinician on day of visit: allergies, medications, problem list, medical history, surgical history, family history, social history, and previous encounter notes pertinent to obesity diagnosis.   I have personally spent 30 minutes total time today in preparation, patient care, nutritional counseling and education,  and documentation for this visit, including the following: review of most recent clinical lab tests, prescribing medications/ refilling medications, reviewing medical assistant documentation, review and interpretation of bioimpedence results.     Darice Haddock, D.O. DABFM, DABOM [facility] [address] [place], [place] [address] [phone]

## 2024-07-22 ENCOUNTER — Encounter: Payer: Self-pay | Admitting: Family Medicine

## 2024-07-22 ENCOUNTER — Ambulatory Visit: Admitting: Family Medicine

## 2024-07-22 VITALS — BP 150/100 | HR 71 | Temp 98.0°F | Ht 62.0 in | Wt 157.0 lb

## 2024-07-22 DIAGNOSIS — R42 Dizziness and giddiness: Secondary | ICD-10-CM

## 2024-07-22 DIAGNOSIS — D0511 Intraductal carcinoma in situ of right breast: Secondary | ICD-10-CM | POA: Diagnosis not present

## 2024-07-22 DIAGNOSIS — E663 Overweight: Secondary | ICD-10-CM

## 2024-07-22 DIAGNOSIS — I10 Essential (primary) hypertension: Secondary | ICD-10-CM

## 2024-07-22 DIAGNOSIS — R519 Headache, unspecified: Secondary | ICD-10-CM

## 2024-07-22 DIAGNOSIS — E785 Hyperlipidemia, unspecified: Secondary | ICD-10-CM | POA: Diagnosis not present

## 2024-07-22 DIAGNOSIS — Z6828 Body mass index (BMI) 28.0-28.9, adult: Secondary | ICD-10-CM

## 2024-07-22 MED ORDER — AMLODIPINE BESYLATE 2.5 MG PO TABS: 2.5000 mg | ORAL_TABLET | Freq: Every day | ORAL | 0 refills | Status: DC

## 2024-07-22 NOTE — Progress Notes (Signed)
 Office: [phone]  /  Fax: [phone]  WEIGHT SUMMARY AND BIOMETRICS  Starting Date: 01/20/24  Starting Weight: 163lb   Weight Lost Since Last Visit: 3lb   Vitals Temp: 98 F (36.7 C) BP: (!) 150/100 Pulse Rate: 71 SpO2: 100 %   Body Composition  Body Fat %: 33.3 % Fat Mass (lbs): 52.4 lbs Muscle Mass (lbs): 99.4 lbs Total Body Water  (lbs): 72.6 lbs Visceral Fat Rating : 6     HPI  Chief Complaint: OBESITY  Jessica Campos is here to discuss her progress with her obesity treatment plan. She is on the the Category 3 Plan and states she is following her eating plan approximately 85 % of the time. She states she is doing 100 minutes of cardio and 50 min of strength a week.   Interval History:  Since last office visit she is down 3 lb This gives her a net weight loss of 6 lb in the past 6 mos of medically supervised weight management She has recovered from bilateral mastectomy 7/8 for breast cancer, awaiting reconstructive surgery 11/18 with Dr Arelia. She stopped Lomaira  2 days ago after having lightheadness and HA She had a cold last week- was not taking cold medicine She denies cough/ SOB/ fevers or chills She is hydrating well She denies CP or DOE, denies leg swelling She has been doing cardio and resistance training exercise 5 days/ wk She has been waking up with a headache, relieved by Tylenol   Pharmacotherapy: off Lomaira  8 mg bid x 2 days  PHYSICAL EXAM:  Blood pressure (!) 150/100, pulse 71, temperature 98 F (36.7 C), height 5' 2 (1.575 m), weight 157 lb (71.2 kg), last menstrual period 08/05/2018, SpO2 100%. Body mass index is 28.72 kg/m.  General: She is healthy appearing, cooperative, alert, well developed, and in no acute distress. PSYCH: Has normal mood, affect and thought process.   Lungs: Normal breathing effort, no conversational dyspnea.  ASSESSMENT AND PLAN  TREATMENT PLAN FOR OBESITY:  Recommended Dietary Goals  Jessica Campos is currently  in the action stage of change. As such, her goal is to continue weight management plan. She has agreed to keeping a food journal and adhering to recommended goals of 1600 calories and 90 g of  protein and practicing portion control and making smarter food choices, such as increasing vegetables and decreasing simple carbohydrates.  Behavioral Intervention  We discussed the following Behavioral Modification Strategies today: increasing lean protein intake to established goals, increasing fiber rich foods, avoiding skipping meals, increasing water  intake , work on meal planning and preparation, work on Counselling psychologist calories using tracking application, keeping healthy foods at home, and work on managing stress, creating time for self-care and relaxation.  Additional resources provided today: NA  Recommended Physical Activity Goals  Jessica Campos has been advised to work up to 150 minutes of moderate intensity aerobic activity a week and strengthening exercises 2-3 times per week for cardiovascular health, weight loss maintenance and preservation of muscle mass.   She has agreed to Continue current level of physical activity   Pharmacotherapy changes for the treatment of obesity: STOP Lomaira   ASSOCIATED CONDITIONS ADDRESSED TODAY  Primary hypertension BP is elevated x 2 and has been elevated >140/90 at prior visits She has not taken anti hypertensive medications She has associated headaches D/c Lomaira  (off x 2 days and Bps are still high) Begin Amlodopine 2.5 mg daily Avoid use of OTC cold meds/ decongestants -     amLODIPine  Besylate; Take 1 tablet (  2.5 mg total) by mouth daily.  Dispense: 30 tablet; Refill: 0  Overweight (BMI 25.0-29.9) Improving Work on calorie tracking to ensure she is getting in enough nutrients  Ductal carcinoma in situ (DCIS) of right breast Doing well mentally and physically post bilateral mastectomy in July, awaiting plastic surgery in November.  BMI  28.0-28.9,adult  Hyperlipidemia, unspecified hyperlipidemia type Cholesterol had greatly improved on Omega 3 fish oil + Red Yeast Rice at the time of her last labs but she has been off since June.  She is eating healthy and avoiding high saturated fat intake  OK to resume Red Yeast Rice.  Stay off Omega 3 Fish oil with upcoming surgery  Lightheadedness New.  She feels a bit lightheaded but is hemodynamically stable and is hydrating well.  She did have a head cold last week but denies sinus pressure or ear pain.  Her BP is elevated and she has a mild HA.  Improve hydration water  and add in a sugar free electrolyte drink daily. F/u in 2 weeks Contact PCP if feeling worse  New onset of headaches Likely related to rise in BP.  Relieved by Tylenol  Work on improving hydration and will keep her OFF of Lomaira  OK to alternate Tylenol  with Ibuprofen  Treat high Bps with amlodopine 2.5 mg daily F/u in 2 weeks Contact PCP if HA worsens    She was informed of the importance of frequent follow up visits to maximize her success with intensive lifestyle modifications for her multiple health conditions.   ATTESTASTION STATEMENTS:  Reviewed by clinician on day of visit: allergies, medications, problem list, medical history, surgical history, family history, social history, and previous encounter notes pertinent to obesity diagnosis.   I have personally spent 32 minutes total time today in preparation, patient care, nutritional counseling and education,  and documentation for this visit, including the following: review of most recent clinical lab tests, prescribing medications/ refilling medications, reviewing medical assistant documentation, review and interpretation of bioimpedence results.     Jessica Campos, D.O. DABFM, DABOM [facility] [address] [place], [place] [address] [phone]

## 2024-07-22 NOTE — Patient Instructions (Signed)
 Try to track daily calorie intake Aim for 1600 cal/ day Don't add workouts in Aim for 80-95 g of protein daily  Add in a sugar electrolyte daily like Propel, GZERO, Body Armour Lyte, Sugar Free Liquid IV  Stay OFF of Lomaira   OK to continue Tylenol  or Ibuprofen  for headaches  Hydrate well with water 

## 2024-08-03 ENCOUNTER — Telehealth: Payer: Self-pay | Admitting: Adult Health

## 2024-08-03 NOTE — Telephone Encounter (Signed)
 Called pt and spoke with her and she is aware of her appt

## 2024-08-05 ENCOUNTER — Ambulatory Visit: Admitting: Family Medicine

## 2024-08-05 ENCOUNTER — Encounter: Payer: Self-pay | Admitting: Family Medicine

## 2024-08-05 VITALS — BP 131/83 | HR 68 | Temp 98.5°F | Ht 62.0 in | Wt 155.0 lb

## 2024-08-05 DIAGNOSIS — I10 Essential (primary) hypertension: Secondary | ICD-10-CM | POA: Diagnosis not present

## 2024-08-05 DIAGNOSIS — R948 Abnormal results of function studies of other organs and systems: Secondary | ICD-10-CM | POA: Diagnosis not present

## 2024-08-05 DIAGNOSIS — D0511 Intraductal carcinoma in situ of right breast: Secondary | ICD-10-CM | POA: Diagnosis not present

## 2024-08-05 DIAGNOSIS — E663 Overweight: Secondary | ICD-10-CM

## 2024-08-05 DIAGNOSIS — Z6828 Body mass index (BMI) 28.0-28.9, adult: Secondary | ICD-10-CM

## 2024-08-05 MED ORDER — AMLODIPINE BESYLATE 2.5 MG PO TABS: 2.5000 mg | ORAL_TABLET | Freq: Every day | ORAL | 1 refills | Status: AC

## 2024-08-05 NOTE — Patient Instructions (Signed)
 Continue Amlodopine daily for BP -- much better!  Aim for a baseline # of steps: >8,000 per day

## 2024-08-05 NOTE — Progress Notes (Signed)
 Office: [phone]  /  Fax: [phone]  WEIGHT SUMMARY AND BIOMETRICS  Starting Date: 01/20/24  Starting Weight: 163lb   Weight Lost Since Last Visit: 2lb   Vitals Temp: 98.5 F (36.9 C) BP: 131/83 Pulse Rate: 68 SpO2: 100 %   Body Composition  Body Fat %: 31.5 % Fat Mass (lbs): 49 lbs Muscle Mass (lbs): 101.4 lbs Total Body Water  (lbs): 70.4 lbs Visceral Fat Rating : 6    HPI  Chief Complaint: OBESITY  Jessica Campos is here to discuss her progress with her obesity treatment plan. She is on the the Category 3 Plan and states she is following her eating plan approximately 85 % of the time. She states she is exercising 30 minutes 5 times per week.  Interval History:  Since last office visit she is down 2 lb She has a net weight loss of 8 lb 6 mos of medically supervised weight management She is up 2 lb of muscle mass and down 3.4 lb of body fat She is doing 20 min of Peloton + 10 min of weight training 3 days; and an extra 30 min walk 2 days/ wk Her job is sedentary She is off of Lomaira  and is feeling better She is getting ready to have reconstructive breast surgery 11/18 Denies excess hunger or cravings  Pharmacotherapy: none  PHYSICAL EXAM:  Blood pressure 131/83, pulse 68, temperature 98.5 F (36.9 C), height 5' 2 (1.575 m), weight 155 lb (70.3 kg), last menstrual period 08/05/2018, SpO2 100%. Body mass index is 28.35 kg/m.  General: She is healthy appearing; cooperative, alert, well developed, and in no acute distress. PSYCH: Has normal mood, affect and thought process.   Lungs: Normal breathing effort, no conversational dyspnea.  ASSESSMENT AND PLAN  TREATMENT PLAN FOR OBESITY:  Recommended Dietary Goals  Jessica Campos is currently in the action stage of change. As such, her goal is to continue weight management plan. She has agreed to practicing portion control and making smarter food choices, such as increasing vegetables and decreasing simple  carbohydrates.  Behavioral Intervention  We discussed the following Behavioral Modification Strategies today: increasing lean protein intake to established goals, increasing fiber rich foods, work on meal planning and preparation, keeping healthy foods at home, work on managing stress, creating time for self-care and relaxation, and continue to practice mindfulness when eating.  Additional resources provided today: NA  Recommended Physical Activity Goals  Jessica Campos has been advised to work up to 150 minutes of moderate intensity aerobic activity a week and strengthening exercises 2-3 times per week for cardiovascular health, weight loss maintenance and preservation of muscle mass.   She has agreed to Increase physical activity in their day and reduce sedentary time (increase NEAT).  Pharmacotherapy changes for the treatment of obesity: none  ASSOCIATED CONDITIONS ADDRESSED TODAY  Primary hypertension BP is at goal and has improved on amlodopine 2.5 mg  She denies having any Has Continue current dose of amlodopine -     amLODIPine  Besylate; Take 1 tablet (2.5 mg total) by mouth daily.  Dispense: 90 tablet; Refill: 1  Overweight (BMI 25.0-29.9) Improving with a  body fat % goal of 30, currently at 31.5% Doing well with healthy eating, regular exercise Bump up # of daily steps with a minimum goal of 8,000 daily.  This can be done by increasing NEAT.  BMI 28.0-28.9,adult  Ductal carcinoma in situ (DCIS) of right breast Doing well mentally and emotionally as she prepares for reconstructive breast surgery next month Aiming  for a body weight of 150 lb going into surgery.  Low basal metabolic rate Plan to repeat fasting IC in Feb as we get ready for maintenance phase of weight loss Ensure lean protein with each meal, working on sleep quality and continue regular exercise    She was informed of the importance of frequent follow up visits to maximize her success with intensive lifestyle  modifications for her multiple health conditions.   ATTESTASTION STATEMENTS:  Reviewed by clinician on day of visit: allergies, medications, problem list, medical history, surgical history, family history, social history, and previous encounter notes pertinent to obesity diagnosis.   I have personally spent 30 minutes total time today in preparation, patient care, nutritional counseling and education,  and documentation for this visit, including the following: review of most recent clinical lab tests, prescribing medications/ refilling medications, reviewing medical assistant documentation, review and interpretation of bioimpedence results.     Jessica Campos, D.O. DABFM, DABOM [facility] [address] [place], [place] [address] [phone]

## 2024-08-06 ENCOUNTER — Inpatient Hospital Stay: Admitting: Internal Medicine

## 2024-08-14 ENCOUNTER — Inpatient Hospital Stay: Attending: Hematology and Oncology | Admitting: Adult Health

## 2024-08-14 ENCOUNTER — Encounter: Payer: Self-pay | Admitting: Adult Health

## 2024-08-14 VITALS — BP 135/82 | HR 81 | Temp 98.1°F | Resp 16 | Wt 161.9 lb

## 2024-08-14 DIAGNOSIS — Z803 Family history of malignant neoplasm of breast: Secondary | ICD-10-CM | POA: Diagnosis not present

## 2024-08-14 DIAGNOSIS — Z8042 Family history of malignant neoplasm of prostate: Secondary | ICD-10-CM | POA: Insufficient documentation

## 2024-08-14 DIAGNOSIS — D0511 Intraductal carcinoma in situ of right breast: Secondary | ICD-10-CM | POA: Diagnosis not present

## 2024-08-14 DIAGNOSIS — Z170 Estrogen receptor positive status [ER+]: Secondary | ICD-10-CM | POA: Insufficient documentation

## 2024-08-14 DIAGNOSIS — Z9013 Acquired absence of bilateral breasts and nipples: Secondary | ICD-10-CM | POA: Insufficient documentation

## 2024-08-14 DIAGNOSIS — Z1721 Progesterone receptor positive status: Secondary | ICD-10-CM | POA: Insufficient documentation

## 2024-08-14 NOTE — Progress Notes (Signed)
 SURVIVORSHIP VISIT:  BRIEF ONCOLOGIC HISTORY:  Oncology History  Ductal carcinoma in situ (DCIS) of right breast  03/02/2024 Initial Diagnosis   Ductal carcinoma in situ (DCIS) of right breast   03/04/2024 Cancer Staging   Staging form: Breast, AJCC 8th Edition - Clinical stage from 03/04/2024: Stage 0 (cTis (DCIS), cN0, cM0, ER+, PR+) - Signed by Loretha Ash, MD on 03/04/2024 Stage prefix: Initial diagnosis Nuclear grade: G2    Genetic Testing   [facility] CancerNext-Expanded Panel+RNA was Negative. 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 Report date is 03/16/2024.  The CancerNext-Expanded gene panel offered by [facility] and includes sequencing, rearrangement, and RNA analysis for the following 77 genes: AIP, ALK, APC, ATM, AXIN2, BAP1, BARD1, BMPR1A, BRCA1, BRCA2, BRIP1, CDC73, CDH1, CDK4, CDKN1B, CDKN2A, CEBPA, CHEK2, CTNNA1, DDX41, DICER1, ETV6, FH, FLCN, GATA2, LZTR1, MAX, MBD4, MEN1, MET, MLH1, MSH2, MSH3, MSH6, MUTYH, NF1, NF2, NTHL1, PALB2, PHOX2B, PMS2, POT1, PRKAR1A, PTCH1, PTEN, RAD51C, RAD51D, RB1, RET, RPS20, RUNX1, SDHA, SDHAF2, SDHB, SDHC, SDHD, SMAD4, SMARCA4, SMARCB1, SMARCE1, STK11, SUFU, TMEM127, TP53, TSC1, TSC2, VHL, and WT1 (sequencing and deletion/duplication); EGFR, HOXB13, KIT, MITF, PDGFRA, POLD1, and POLE (sequencing only); EPCAM and GREM1 (deletion/duplication only).     05/12/2024 Surgery   Left Mastectomy: Negative for carcinoma Right Mastectomy: DCIS; ER/PR+; no lymph nodes submitted   08/14/2024 Cancer Staging   Staging form: Breast, AJCC 8th Edition - Pathologic: Stage 0 (pTis (DCIS), pN0, cM0, ER+, PR+) - Signed by Crawford Morna Pickle, NP on 08/14/2024     INTERVAL HISTORY:  Discussed the use of AI scribe software for clinical note transcription with the patient, who gave verbal consent to proceed.  History of Present Illness Jessica Campos is a 43 year old female with stage zero breast cancer who  presents for follow-up after bilateral mastectomy and reconstruction.  She underwent a bilateral mastectomy in 2019 for stage zero noninvasive breast cancer, followed by reconstruction surgery. Genetic testing was negative for known cancer-related genes. She experiences persistent numbness in several areas post-surgery.  She has started a new workout regimen, including boxing and abdominal exercises, which she finds challenging. She is mindful of her diet, focusing on limiting processed and red meats, and is interested in recipes that align with her dietary goals. She consumes alcohol at a rate of one to two drinks per month.    REVIEW OF SYSTEMS:  Review of Systems  Constitutional:  Negative for appetite change, chills, fatigue, fever and unexpected weight change.  HENT:   Negative for hearing loss, lump/mass and trouble swallowing.   Eyes:  Negative for eye problems and icterus.  Respiratory:  Negative for chest tightness, cough and shortness of breath.   Cardiovascular:  Negative for chest pain, leg swelling and palpitations.  Gastrointestinal:  Negative for abdominal distention, abdominal pain, constipation, diarrhea, nausea and vomiting.  Endocrine: Negative for hot flashes.  Genitourinary:  Negative for difficulty urinating.   Musculoskeletal:  Negative for arthralgias.  Skin:  Negative for itching and rash.  Neurological:  Negative for dizziness, extremity weakness, headaches and numbness.  Hematological:  Negative for adenopathy. Does not bruise/bleed easily.  Psychiatric/Behavioral:  Negative for depression. The patient is not nervous/anxious.    Breast: Denies any new nodularity, masses, tenderness, nipple changes, or nipple discharge.       PAST MEDICAL/SURGICAL HISTORY:  Past Medical History:  Diagnosis Date   Ankle fracture 2005   Right   Anxiety    Breast cancer (HCC)  Elevated blood pressure reading    after second c section, resolved on its own, not currently on  BP medications   Fibroids    High cholesterol    Joint pain    Obesity (BMI 30-39.9)    PONV (postoperative nausea and vomiting)    Seasonal allergies    Vitamin D  deficiency    Past Surgical History:  Procedure Laterality Date   ANKLE SURGERY Right 2005   BREAST BIOPSY Right 02/25/2024   US  RT BREAST BX W LOC DEV 1ST LESION IMG BX SPEC US  GUIDE 02/25/2024 [facility] MAMMOGRAPHY   BREAST RECONSTRUCTION Bilateral 05/12/2024   Procedure: BILTERAL BREAST RECONSTRUCTION WITH TISSUE EXPANDERS AND ACELLULAR DERMIS;  Surgeon: Arelia Filippo, MD;  Location: [facility];  Service: Plastics;  Laterality: Bilateral;   CESAREAN SECTION     x2   FOOT SURGERY Right 2013   NIPPLE SPARING MASTECTOMY Bilateral 05/12/2024   Procedure: RIGHT NIPPLE SPARING MASTECYOMY, LEFT RISK REDUCING NIPPLE SPARING MASTECTOMY;  Surgeon: Vanderbilt Ned, MD;  Location:  SURGERY CENTER;  Service: General;  Laterality: Bilateral;  BILATERAL NIPPLE SPARING MASTECTOMY GEN w/PEC BLOCK   PARTIAL HYSTERECTOMY       ALLERGIES:  No Known Allergies   CURRENT MEDICATIONS:  Outpatient Encounter Medications as of 08/14/2024  Medication Sig   amLODipine  (NORVASC ) 2.5 MG tablet Take 1 tablet (2.5 mg total) by mouth daily.   Bacillus Coagulans-Inulin (PROBIOTIC) 1-250 BILLION-MG CAPS Take by mouth.   Boric Acid 600 MG SUPP    cetirizine (ZYRTEC) 10 MG tablet Take 10 mg by mouth daily.   ibuprofen  (ADVIL ) 800 MG tablet Take 1 tablet (800 mg total) by mouth every 8 (eight) hours as needed.   Omega-3 Fatty Acids (FISH OIL) 1000 MG CAPS    Red Yeast Rice Extract 600 MG CAPS    No facility-administered encounter medications on file as of 08/14/2024.     ONCOLOGIC FAMILY HISTORY:  Family History  Problem Relation Age of Onset   Hyperlipidemia Mother    Hypertension Mother    Obesity Mother    Obesity Father    Stroke Father    Hyperlipidemia Father    Diabetes Father    Hypertension Father     Diabetes type II Father    Alcoholism Father    Prostate cancer Maternal Uncle 4   Prostate cancer Maternal Uncle 67       metastatic   Breast cancer Paternal Aunt 50       recurred at 14   Breast cancer Other 62       maternal great aunt     SOCIAL HISTORY:  Social History   Socioeconomic History   Marital status: Married    Spouse name: Prentice   Number of children: Not on file   Years of education: Not on file   Highest education level: Not on file  Occupational History   Not on file  Tobacco Use   Smoking status: Never   Smokeless tobacco: Never  Vaping Use   Vaping status: Never Used  Substance and Sexual Activity   Alcohol use: Yes    Comment: occasional   Drug use: Never   Sexual activity: Yes  Other Topics Concern   Not on file  Social History Narrative   Not on file   Social Drivers of Health   Financial Resource Strain: Not on file  Food Insecurity: No Food Insecurity (03/04/2024)   Hunger Vital Sign    Worried About Running Out  of Food in the Last Year: Never true    Ran Out of Food in the Last Year: Never true  Transportation Needs: No Transportation Needs (03/04/2024)   PRAPARE - Administrator, Civil Service (Medical): No    Lack of Transportation (Non-Medical): No  Physical Activity: Not on file  Stress: Not on file  Social Connections: Unknown (06/16/2023)   [facility]   Social Network    Social Network: Not on file  Intimate Partner Violence: Not At Risk (03/04/2024)   Humiliation, Afraid, Rape, and Kick questionnaire    Fear of Current or Ex-Partner: No    Emotionally Abused: No    Physically Abused: No    Sexually Abused: No     OBSERVATIONS/OBJECTIVE:  BP 135/82 (BP Location: Left Arm, Patient Position: Sitting)   Pulse 81   Temp 98.1 F (36.7 C) (Temporal)   Resp 16   Wt 161 lb 14.4 oz (73.4 kg)   LMP 08/05/2018 Comment: Continuous bleeding since Oct.  SpO2 100%   BMI 29.61 kg/m  GENERAL: Patient  is a well appearing female in no acute distress HEENT:  Sclerae anicteric.  Oropharynx clear and moist. No ulcerations or evidence of oropharyngeal candidiasis. Neck is supple.  NODES:  No cervical, supraclavicular, or axillary lymphadenopathy palpated.  BREAST EXAM:  s/p bilateral mastectomies, no sign of local recurrence LUNGS:  Clear to auscultation bilaterally.  No wheezes or rhonchi. HEART:  Regular rate and rhythm. No murmur appreciated. ABDOMEN:  Soft, nontender.  Positive, normoactive bowel sounds. No organomegaly palpated. MSK:  No focal spinal tenderness to palpation. Full range of motion bilaterally in the upper extremities. EXTREMITIES:  No peripheral edema.   SKIN:  Clear with no obvious rashes or skin changes. No nail dyscrasia. NEURO:  Nonfocal. Well oriented.  Appropriate affect.   LABORATORY DATA:  None for this visit.  DIAGNOSTIC IMAGING:  None for this visit.      ASSESSMENT AND PLAN:  Ms.. Raben is a pleasant 43 y.o. female with Stage 0 right breast DCIS, ER+/PR+, diagnosed in 02/2024, treated with bilateral mastectomies.  She presents to the Survivorship Clinic for our initial meeting and routine follow-up post-completion of treatment for breast cancer.    1. Stage 0 right breast cancer:  Ms. Peppers is continuing to recover from definitive treatment for breast cancer. She will f/u annually with surgery. F/u with us  is optional and she will forego f/u but knows to reach out for any questions or concerns moving forward. Today, a comprehensive survivorship care plan and treatment summary was reviewed with the patient today detailing her breast cancer diagnosis, treatment course, potential late/long-term effects of treatment, appropriate follow-up care with recommendations for the future, and patient education resources.  A copy of this summary, along with a letter will be sent to the patient's primary care provider via mail/fax/In Basket message after today's visit.     2. Bone health:  She was given education on specific activities to promote bone health.  3. Cancer screening:  Due to Ms. Ouzts's history and her age, she should receive screening for skin cancers, colon cancer, and gynecologic cancers.  The information and recommendations are listed on the patient's comprehensive care plan/treatment summary and were reviewed in detail with the patient.    4. Health maintenance and wellness promotion: Ms. Camino was encouraged to consume 5-7 servings of fruits and vegetables per day. We reviewed the Nutrition Rainbow handout.  She was also encouraged to engage in  moderate to vigorous exercise for 30 minutes per day most days of the week.  She was instructed to limit her alcohol consumption and continue to abstain from tobacco use.     5. Support services/counseling: It is not uncommon for this period of the patient's cancer care trajectory to be one of many emotions and stressors.   She was given information regarding our available services and encouraged to contact me with any questions or for help enrolling in any of our support group/programs.    Follow up instructions:    -Return to cancer center PRN  -She is welcome to return back to the Survivorship Clinic at any time; no additional follow-up needed at this time.  -Consider referral back to survivorship as a long-term survivor for continued surveillance  The patient was provided an opportunity to ask questions and all were answered. The patient agreed with the plan and demonstrated an understanding of the instructions.   Total encounter time:30 minutes*in face-to-face visit time, chart review, lab review, care coordination, order entry, and documentation of the encounter time.    Morna Kendall, NP 08/16/24 10:53 PM Medical Oncology and Hematology [facility] [address] [place], [place] [address] Tel. [phone]    Fax. [phone]  *Total Encounter Time as defined by  the Centers for Medicare and Medicaid Services includes, in addition to the face-to-face time of a patient visit (documented in the note above) non-face-to-face time: obtaining and reviewing outside history, ordering and reviewing medications, tests or procedures, care coordination (communications with other health care professionals or caregivers) and documentation in the medical record.

## 2024-09-07 ENCOUNTER — Ambulatory Visit: Admitting: Family Medicine

## 2024-09-08 ENCOUNTER — Ambulatory Visit: Admitting: Internal Medicine

## 2024-09-08 ENCOUNTER — Encounter: Payer: Self-pay | Admitting: Internal Medicine

## 2024-09-08 ENCOUNTER — Encounter: Payer: Self-pay | Admitting: Adult Health

## 2024-09-08 VITALS — BP 110/80 | HR 65 | Temp 97.9°F | Wt 162.2 lb

## 2024-09-08 DIAGNOSIS — E669 Obesity, unspecified: Secondary | ICD-10-CM | POA: Diagnosis not present

## 2024-09-08 DIAGNOSIS — Z6829 Body mass index (BMI) 29.0-29.9, adult: Secondary | ICD-10-CM | POA: Diagnosis not present

## 2024-09-08 DIAGNOSIS — R5383 Other fatigue: Secondary | ICD-10-CM | POA: Diagnosis not present

## 2024-09-08 DIAGNOSIS — E559 Vitamin D deficiency, unspecified: Secondary | ICD-10-CM

## 2024-09-08 LAB — CBC WITH DIFFERENTIAL/PLATELET
Basophils Absolute: 0 K/uL (ref 0.0–0.1)
Basophils Relative: 0.7 % (ref 0.0–3.0)
Eosinophils Absolute: 0.1 K/uL (ref 0.0–0.7)
Eosinophils Relative: 2.5 % (ref 0.0–5.0)
HCT: 38.7 % (ref 36.0–46.0)
Hemoglobin: 13 g/dL (ref 12.0–15.0)
Lymphocytes Relative: 43.9 % (ref 12.0–46.0)
Lymphs Abs: 2.2 K/uL (ref 0.7–4.0)
MCHC: 33.5 g/dL (ref 30.0–36.0)
MCV: 87.8 fl (ref 78.0–100.0)
Monocytes Absolute: 0.4 K/uL (ref 0.1–1.0)
Monocytes Relative: 7.7 % (ref 3.0–12.0)
Neutro Abs: 2.3 K/uL (ref 1.4–7.7)
Neutrophils Relative %: 45.2 % (ref 43.0–77.0)
Platelets: 396 K/uL (ref 150.0–400.0)
RBC: 4.41 Mil/uL (ref 3.87–5.11)
RDW: 13.6 % (ref 11.5–15.5)
WBC: 5 K/uL (ref 4.0–10.5)

## 2024-09-08 NOTE — Progress Notes (Signed)
 Established Patient Office Visit     CC/Reason for Visit: Fatigue  HPI: Jessica Campos is a 43 y.o. female who is coming in today for the above mentioned reasons. Past Medical History is significant for: Breast cancer who is going for reconstruction later this month.  She was recently started on 2.5 mg of amlodipine  for hypertension.  Headaches have improved since.  She is having issues with fatigue.  She has no trouble falling asleep but feels like she wakes up frequently at night.  At times she is very hot and sweaty.  She has no energy after 2 to 3:00 in the afternoon.  She has never snored per her report.  Does not feel like she wakes up gasping for air.   Past Medical/Surgical History: Past Medical History:  Diagnosis Date   Ankle fracture 2005   Right   Anxiety    Breast cancer (HCC)    Elevated blood pressure reading    after second c section, resolved on its own, not currently on BP medications   Fibroids    High cholesterol    Joint pain    Obesity (BMI 30-39.9)    PONV (postoperative nausea and vomiting)    Seasonal allergies    Vitamin D  deficiency     Past Surgical History:  Procedure Laterality Date   ANKLE SURGERY Right 2005   BREAST BIOPSY Right 02/25/2024   US  RT BREAST BX W LOC DEV 1ST LESION IMG BX SPEC US  GUIDE 02/25/2024 [facility] MAMMOGRAPHY   BREAST RECONSTRUCTION Bilateral 05/12/2024   Procedure: BILTERAL BREAST RECONSTRUCTION WITH TISSUE EXPANDERS AND ACELLULAR DERMIS;  Surgeon: Arelia Filippo, MD;  Location: [facility];  Service: Plastics;  Laterality: Bilateral;   CESAREAN SECTION     x2   FOOT SURGERY Right 2013   NIPPLE SPARING MASTECTOMY Bilateral 05/12/2024   Procedure: RIGHT NIPPLE SPARING MASTECYOMY, LEFT RISK REDUCING NIPPLE SPARING MASTECTOMY;  Surgeon: Vanderbilt Ned, MD;  Location: [facility];  Service: General;  Laterality: Bilateral;  BILATERAL NIPPLE SPARING MASTECTOMY GEN w/PEC BLOCK   PARTIAL  HYSTERECTOMY      Social History:  reports that she has never smoked. She has never used smokeless tobacco. She reports current alcohol use. She reports that she does not use drugs.  Allergies: No Known Allergies  Family History:  Family History  Problem Relation Age of Onset   Hyperlipidemia Mother    Hypertension Mother    Obesity Mother    Obesity Father    Stroke Father    Hyperlipidemia Father    Diabetes Father    Hypertension Father    Diabetes type II Father    Alcoholism Father    Prostate cancer Maternal Uncle 45   Prostate cancer Maternal Uncle 19       metastatic   Breast cancer Paternal Aunt 50       recurred at 12   Breast cancer Other 53       maternal great aunt     Current Outpatient Medications:    amLODipine  (NORVASC ) 2.5 MG tablet, Take 1 tablet (2.5 mg total) by mouth daily., Disp: 90 tablet, Rfl: 1   Bacillus Coagulans-Inulin (PROBIOTIC) 1-250 BILLION-MG CAPS, Take by mouth., Disp: , Rfl:    Boric Acid 600 MG SUPP, , Disp: , Rfl:    ibuprofen  (ADVIL ) 800 MG tablet, Take 1 tablet (800 mg total) by mouth every 8 (eight) hours as needed., Disp: 30 tablet, Rfl: 0  cetirizine (ZYRTEC) 10 MG tablet, Take 10 mg by mouth daily. (Patient not taking: Reported on 09/08/2024), Disp: , Rfl:    Omega-3 Fatty Acids (FISH OIL) 1000 MG CAPS, , Disp: , Rfl:    Red Yeast Rice Extract 600 MG CAPS, , Disp: , Rfl:   Review of Systems:  Negative unless indicated in HPI.   Physical Exam: Vitals:   09/08/24 0830  BP: 110/80  Pulse: 65  Temp: 97.9 F (36.6 C)  TempSrc: Oral  SpO2: 99%  Weight: 162 lb 3.2 oz (73.6 kg)    Body mass index is 29.67 kg/m.   Physical Exam Vitals reviewed.  Constitutional:      Appearance: Normal appearance.  HENT:     Head: Normocephalic and atraumatic.  Eyes:     Conjunctiva/sclera: Conjunctivae normal.  Cardiovascular:     Rate and Rhythm: Normal rate and regular rhythm.  Pulmonary:     Effort: Pulmonary effort is  normal.     Breath sounds: Normal breath sounds.  Skin:    General: Skin is warm and dry.  Neurological:     General: No focal deficit present.     Mental Status: She is alert and oriented to person, place, and time.  Psychiatric:        Mood and Affect: Mood normal.        Behavior: Behavior normal.        Thought Content: Thought content normal.        Judgment: Judgment normal.      Impression and Plan:  Fatigue, unspecified type -     CBC with Differential/Platelet; Future -     IBC + Ferritin; Future  Vitamin D  deficiency -     VITAMIN D  25 Hydroxy (Vit-D Deficiency, Fractures); Future  Obesity (BMI 30-39.9) -     Vitamin B12; Future -     TSH; Future   - Check labs including CBC, iron studies, TSH, vitamin B12, vitamin D  to rule out any deficiencies that need to be replaced. - Does not sound classic for sleep apnea but can consider if issues persist. - I think her symptoms have a lot to do with perimenopause, however she is not a candidate for hormone replacement therapy given her breast cancer history.  Time spent:31 minutes reviewing chart, interviewing and examining patient and formulating plan of care.     Tully Theophilus Andrews, MD [facility]

## 2024-09-09 LAB — VITAMIN D 25 HYDROXY (VIT D DEFICIENCY, FRACTURES): VITD: 37.43 ng/mL (ref 30.00–100.00)

## 2024-09-09 LAB — IBC + FERRITIN
Ferritin: 25.6 ng/mL (ref 10.0–291.0)
Iron: 74 ug/dL (ref 42–145)
Saturation Ratios: 22.5 % (ref 20.0–50.0)
TIBC: 329 ug/dL (ref 250.0–450.0)
Transferrin: 235 mg/dL (ref 212.0–360.0)

## 2024-09-09 LAB — TSH: TSH: 0.87 u[IU]/mL (ref 0.35–5.50)

## 2024-09-09 LAB — VITAMIN B12: Vitamin B-12: 478 pg/mL (ref 211–911)

## 2024-09-10 ENCOUNTER — Ambulatory Visit: Payer: Self-pay | Admitting: Internal Medicine

## 2024-09-14 ENCOUNTER — Ambulatory Visit: Admitting: Family Medicine

## 2024-09-14 ENCOUNTER — Encounter: Payer: Self-pay | Admitting: Family Medicine

## 2024-09-14 VITALS — BP 124/78 | HR 70 | Temp 98.4°F | Ht 62.0 in | Wt 158.0 lb

## 2024-09-14 DIAGNOSIS — Z6828 Body mass index (BMI) 28.0-28.9, adult: Secondary | ICD-10-CM | POA: Diagnosis not present

## 2024-09-14 DIAGNOSIS — E559 Vitamin D deficiency, unspecified: Secondary | ICD-10-CM

## 2024-09-14 DIAGNOSIS — E663 Overweight: Secondary | ICD-10-CM | POA: Diagnosis not present

## 2024-09-14 DIAGNOSIS — R5383 Other fatigue: Secondary | ICD-10-CM | POA: Diagnosis not present

## 2024-09-14 DIAGNOSIS — D0511 Intraductal carcinoma in situ of right breast: Secondary | ICD-10-CM

## 2024-09-14 MED ORDER — VITAMIN D (ERGOCALCIFEROL) 1.25 MG (50000 UNIT) PO CAPS: 50000.0000 [IU] | ORAL_CAPSULE | ORAL | 0 refills | Status: DC

## 2024-09-14 NOTE — Progress Notes (Signed)
 Office: [phone]  /  Fax: [phone]  WEIGHT SUMMARY AND BIOMETRICS  Starting Date: 01/20/24  Starting Weight: 163lb   Weight Lost Since Last Visit: 0lb   Vitals Temp: 98.4 F (36.9 C) BP: 124/78 Pulse Rate: 70 SpO2: 100 %   Body Composition  Body Fat %: 34.3 % Fat Mass (lbs): 54.4 lbs Muscle Mass (lbs): 98.8 lbs Total Body Water  (lbs): 70.4 lbs Visceral Fat Rating : 7    HPI  Chief Complaint: OBESITY  Jessica Campos is here to discuss her progress with her obesity treatment plan. She is on the the Category 3 Plan and states she is following her eating plan approximately 50 % of the time. She states she is exercising 30-60 minutes 3 times per week.   Interval History:  Since last office visit she is up 3 lb She has been more tired lately, not working out as much She does better with her eating at work but at home is snacking more She has been indulging more on sweets but is trying to keep junk food out of the house She is scheduled for breast reconstructive surgery scheduled 11/18 and will be out of work x 4 weeks She has a net weight loss of 5 lb in 7 mos She has been waking up at night more often  Pharmacotherapy: none Had adverse SE from [place]   PHYSICAL EXAM:  Blood pressure 124/78, pulse 70, temperature 98.4 F (36.9 C), height 5' 2 (1.575 m), weight 158 lb (71.7 kg), last menstrual period 08/05/2018, SpO2 100%. Body mass index is 28.9 kg/m.  General: She is healthy appearing, cooperative, alert, well developed, and in no acute distress. PSYCH: Has normal mood, affect and thought process.   Lungs: Normal breathing effort, no conversational dyspnea.   ASSESSMENT AND PLAN  TREATMENT PLAN FOR OBESITY:  Recommended Dietary Goals  Jessica Campos is currently in the action stage of change. As such, her goal is to continue weight management plan. She has agreed to keeping a food journal and adhering to recommended goals of 1500 calories and grams of protein  and practicing portion control and making smarter food choices, such as increasing vegetables and decreasing simple carbohydrates. - Add in a high-fiber carbohydrate plus healthy fat with lean protein midafternoon -Limit high sugar foods and drinks -Keep easy healthy dinners at home  Behavioral Intervention  We discussed the following Behavioral Modification Strategies today: increasing lean protein intake to established goals, increasing fiber rich foods, increasing water  intake , keeping healthy foods at home, work on managing stress, creating time for self-care and relaxation, avoiding temptations and identifying enticing environmental cues, and planning for success.  Additional resources provided today: NA  Recommended Physical Activity Goals  Jessica Campos has been advised to work up to 150 minutes of moderate intensity aerobic activity a week and strengthening exercises 2-3 times per week for cardiovascular health, weight loss maintenance and preservation of muscle mass.   She has agreed to Think about enjoyable ways to increase daily physical activity and overcoming barriers to exercise and Increase physical activity in their day and reduce sedentary time (increase NEAT). - Try adding in a 15 to 20-minute afternoon walk  Pharmacotherapy changes for the treatment of obesity: None  ASSOCIATED CONDITIONS ADDRESSED TODAY  Vitamin D  deficiency Last vitamin D  Lab Results  Component Value Date   VD25OH 37.43 09/08/2024  She was on vitamin D  50,000 IU once weekly with her PCP but did run out of this prescription.  Her vitamin D  level was  rechecked by her PCP 11/4 at 37 with a goal over 50.  She has experienced increased levels of fatigue lately.  She will restart vitamin D  50,000 IU once weekly after her upcoming surgery scheduled for 11/18 Repeat vitamin D  level in 3 months -     Vitamin D  (Ergocalciferol ); Take 1 capsule (50,000 Units total) by mouth every 7 (seven) days.  Dispense: 12 capsule;  Refill: 0  Overweight (BMI 25.0-29.9) Stable Will plan to ramp up exercise when she is cleared by her surgeon postop Struggling more with higher levels of fatigue and poor sleep at night Will work on improving nutrition and walking time  BMI 28.0-28.9,adult  Ductal carcinoma in situ (DCIS) of right breast Doing well financially with a good support system at home.  Scheduled for reconstructive breast surgery on 11/18  Fatigue, unspecified type Reviewed labs drawn by her PCP on 11/4.  She has no findings of iron deficiency anemia, has a borderline low vitamin D  level, normal B12.  She is now off from vitamin D  for upcoming surgery.  She agrees to restarting vitamin D  50,000 IU once weekly postop.  Will work on increasing fiber and lean protein intake, avoiding ultra processed foods and high sugar items that can increase levels of fatigue throughout the day.  She may try magnesium supplement at night for sleep.     She was informed of the importance of frequent follow up visits to maximize her success with intensive lifestyle modifications for her multiple health conditions.   ATTESTASTION STATEMENTS:  Reviewed by clinician on day of visit: allergies, medications, problem list, medical history, surgical history, family history, social history, and previous encounter notes pertinent to obesity diagnosis.   I have personally spent 30 minutes total time today in preparation, patient care, nutritional counseling and education,  and documentation for this visit, including the following: review of most recent clinical lab tests, prescribing medications/ refilling medications, reviewing medical assistant documentation, review and interpretation of bioimpedence results.     Darice Haddock, D.O. DABFM, DABOM [facility] [address] [place], [place] [address] [phone]

## 2024-09-14 NOTE — Patient Instructions (Signed)
 Try a Magnesium powder like calm for sleep  Restart RX vitamin D  once a week after surgery

## 2024-09-15 ENCOUNTER — Encounter (HOSPITAL_BASED_OUTPATIENT_CLINIC_OR_DEPARTMENT_OTHER): Payer: Self-pay | Admitting: Plastic Surgery

## 2024-09-15 ENCOUNTER — Other Ambulatory Visit: Payer: Self-pay

## 2024-09-15 NOTE — Progress Notes (Signed)
   09/15/24 1022  PAT Phone Screen  Is the patient taking a GLP-1 receptor agonist? No  Do You Have Diabetes? No  Do You Have Hypertension? Yes  Have You Ever Been to the ER for Asthma? No  Have You Taken Oral Steroids in the Past 3 Months? No  Do you Take Phenteramine or any Other Diet Drugs? No  Recent  Lab Work, EKG, CXR? (S)  Yes (EKG)  Where was this test performed? [facility]  Do you have a history of heart problems? No  Any Recent Hospitalizations? No  Height 5' 3 (1.6 m)  Weight 71.2 kg  Pat Appointment Scheduled No  Reason for No Appointment Not Needed (pt requests ensure and soap)

## 2024-09-16 ENCOUNTER — Ambulatory Visit: Admitting: Family Medicine

## 2024-09-18 MED ORDER — CHLORHEXIDINE GLUCONATE CLOTH 2 % EX PADS: 6.0000 | MEDICATED_PAD | Freq: Once | CUTANEOUS | Status: DC

## 2024-09-18 NOTE — Progress Notes (Signed)

## 2024-09-21 NOTE — Anesthesia Preprocedure Evaluation (Signed)
 Anesthesia Evaluation  Patient identified by MRN, date of birth, ID band Patient awake    Reviewed: Allergy & Precautions, NPO status , Patient's Chart, lab work & pertinent test results  History of Anesthesia Complications (+) PONV and history of anesthetic complications  Airway Mallampati: II  TM Distance: >3 FB Neck ROM: Full   Comment: Previous grade I view with MAC 3, easy mask Dental no notable dental hx. (+) Dental Advisory Given   Pulmonary neg pulmonary ROS   Pulmonary exam normal breath sounds clear to auscultation       Cardiovascular hypertension, (-) angina (-) Past MI Normal cardiovascular exam Rhythm:Regular Rate:Normal     Neuro/Psych  PSYCHIATRIC DISORDERS Anxiety     negative neurological ROS     GI/Hepatic negative GI ROS, Neg liver ROS,neg GERD  ,,  Endo/Other  neg diabetes    Renal/GU negative Renal ROS  negative genitourinary   Musculoskeletal negative musculoskeletal ROS (+)    Abdominal   Peds  Hematology negative hematology ROS (+) Lab Results      Component                Value               Date                            HGB                      13.0                09/08/2024                HCT                      38.7                09/08/2024                     Anesthesia Other Findings   Reproductive/Obstetrics                              Anesthesia Physical Anesthesia Plan  ASA: 2  Anesthesia Plan: General   Post-op Pain Management: Tylenol  PO (pre-op)*, Dilaudid  IV and Ketamine IV*   Induction: Intravenous  PONV Risk Score and Plan: 4 or greater and Ondansetron , Dexamethasone , Midazolam , Scopolamine  patch - Pre-op, Propofol  infusion, TIVA and Treatment may vary due to age or medical condition  Airway Management Planned: Oral ETT  Additional Equipment: None  Intra-op Plan:   Post-operative Plan: Extubation in OR  Informed Consent: I  have reviewed the patients History and Physical, chart, labs and discussed the procedure including the risks, benefits and alternatives for the proposed anesthesia with the patient or authorized representative who has indicated his/her understanding and acceptance.     Dental advisory given  Plan Discussed with: CRNA and Anesthesiologist  Anesthesia Plan Comments:          Anesthesia Quick Evaluation

## 2024-09-22 ENCOUNTER — Ambulatory Visit (HOSPITAL_BASED_OUTPATIENT_CLINIC_OR_DEPARTMENT_OTHER): Payer: Self-pay | Admitting: Anesthesiology

## 2024-09-22 ENCOUNTER — Encounter (HOSPITAL_BASED_OUTPATIENT_CLINIC_OR_DEPARTMENT_OTHER)
Admission: RE | Disposition: A | Discharge status: Home / Self Care | Payer: Self-pay | Source: Home / Self Care | Attending: Plastic Surgery

## 2024-09-22 ENCOUNTER — Encounter (HOSPITAL_BASED_OUTPATIENT_CLINIC_OR_DEPARTMENT_OTHER): Payer: Self-pay | Admitting: Plastic Surgery

## 2024-09-22 ENCOUNTER — Encounter (HOSPITAL_BASED_OUTPATIENT_CLINIC_OR_DEPARTMENT_OTHER): Payer: Self-pay | Admitting: Anesthesiology

## 2024-09-22 ENCOUNTER — Ambulatory Visit (HOSPITAL_BASED_OUTPATIENT_CLINIC_OR_DEPARTMENT_OTHER)
Admission: RE | Admit: 2024-09-22 | Discharge: 2024-09-22 | Disposition: A | Discharge status: Home / Self Care | Attending: Plastic Surgery | Admitting: Plastic Surgery

## 2024-09-22 DIAGNOSIS — Z421 Encounter for breast reconstruction following mastectomy: Secondary | ICD-10-CM | POA: Diagnosis present

## 2024-09-22 DIAGNOSIS — Z86000 Personal history of in-situ neoplasm of breast: Secondary | ICD-10-CM | POA: Diagnosis not present

## 2024-09-22 DIAGNOSIS — Z853 Personal history of malignant neoplasm of breast: Secondary | ICD-10-CM | POA: Diagnosis not present

## 2024-09-22 DIAGNOSIS — Z9013 Acquired absence of bilateral breasts and nipples: Secondary | ICD-10-CM | POA: Insufficient documentation

## 2024-09-22 DIAGNOSIS — D0511 Intraductal carcinoma in situ of right breast: Secondary | ICD-10-CM | POA: Diagnosis present

## 2024-09-22 HISTORY — PX: REMOVAL OF TISSUE EXPANDER AND PLACEMENT OF IMPLANT: SHX6457

## 2024-09-22 HISTORY — PX: LIPOSUCTION WITH LIPOFILLING: SHX6436

## 2024-09-22 SURGERY — REMOVAL, TISSUE EXPANDER, BREAST, WITH IMPLANT INSERTION
Anesthesia: General | Site: Breast | Laterality: Bilateral

## 2024-09-22 MED ORDER — SULFAMETHOXAZOLE-TRIMETHOPRIM 800-160 MG PO TABS: 1.0000 | ORAL_TABLET | Freq: Two times a day (BID) | ORAL | 0 refills | Status: DC

## 2024-09-22 MED ORDER — CELECOXIB 200 MG PO CAPS
200.0000 mg | ORAL_CAPSULE | ORAL | Status: AC
  Administered 2024-09-22: 200 mg via ORAL

## 2024-09-22 MED ORDER — DEXMEDETOMIDINE HCL IN NACL 80 MCG/20ML IV SOLN
INTRAVENOUS | Status: AC
  Filled 2024-09-22: qty 20

## 2024-09-22 MED ORDER — GENTAMICIN SULFATE 40 MG/ML IJ SOLN
INTRAMUSCULAR | Status: AC
  Filled 2024-09-22: qty 10

## 2024-09-22 MED ORDER — DEXMEDETOMIDINE HCL IN NACL 80 MCG/20ML IV SOLN
INTRAVENOUS | Status: DC | PRN
  Administered 2024-09-22: 8 ug via INTRAVENOUS

## 2024-09-22 MED ORDER — ROCURONIUM BROMIDE 10 MG/ML (PF) SYRINGE
PREFILLED_SYRINGE | INTRAVENOUS | Status: AC
  Filled 2024-09-22: qty 10

## 2024-09-22 MED ORDER — CELECOXIB 200 MG PO CAPS
ORAL_CAPSULE | ORAL | Status: AC
  Filled 2024-09-22: qty 1

## 2024-09-22 MED ORDER — DEXAMETHASONE SOD PHOSPHATE PF 10 MG/ML IJ SOLN
INTRAMUSCULAR | Status: DC | PRN
  Administered 2024-09-22: 10 mg via INTRAVENOUS

## 2024-09-22 MED ORDER — LIDOCAINE 2% (20 MG/ML) 5 ML SYRINGE
INTRAMUSCULAR | Status: AC
  Filled 2024-09-22: qty 5

## 2024-09-22 MED ORDER — FENTANYL CITRATE (PF) 100 MCG/2ML IJ SOLN
INTRAMUSCULAR | Status: DC | PRN
  Administered 2024-09-22 (×2): 50 ug via INTRAVENOUS

## 2024-09-22 MED ORDER — ROCURONIUM BROMIDE 100 MG/10ML IV SOLN
INTRAVENOUS | Status: DC | PRN
  Administered 2024-09-22: 60 mg via INTRAVENOUS

## 2024-09-22 MED ORDER — HYDROMORPHONE HCL 1 MG/ML IJ SOLN: 0.2500 mg | INTRAMUSCULAR | Status: DC | PRN

## 2024-09-22 MED ORDER — GABAPENTIN 300 MG PO CAPS
ORAL_CAPSULE | ORAL | Status: AC
  Filled 2024-09-22: qty 1

## 2024-09-22 MED ORDER — PHENYLEPHRINE HCL (PRESSORS) 10 MG/ML IV SOLN
INTRAVENOUS | Status: DC | PRN
Start: 2024-09-22 — End: 2024-09-22
  Administered 2024-09-22: 80 ug via INTRAVENOUS

## 2024-09-22 MED ORDER — ONDANSETRON HCL 4 MG/2ML IJ SOLN: 4.0000 mg | Freq: Once | INTRAMUSCULAR | Status: DC | PRN

## 2024-09-22 MED ORDER — BUPIVACAINE HCL (PF) 0.5 % IJ SOLN
INTRAMUSCULAR | Status: DC | PRN
  Administered 2024-09-22: 30 mL

## 2024-09-22 MED ORDER — PROPOFOL 500 MG/50ML IV EMUL
INTRAVENOUS | Status: AC
  Filled 2024-09-22: qty 50

## 2024-09-22 MED ORDER — LACTATED RINGERS IV SOLN: INTRAVENOUS | Status: DC

## 2024-09-22 MED ORDER — ONDANSETRON HCL 4 MG/2ML IJ SOLN
INTRAMUSCULAR | Status: AC
  Filled 2024-09-22: qty 2

## 2024-09-22 MED ORDER — ACETAMINOPHEN 10 MG/ML IV SOLN: 1000.0000 mg | Freq: Once | INTRAVENOUS | Status: DC | PRN

## 2024-09-22 MED ORDER — GABAPENTIN 300 MG PO CAPS
300.0000 mg | ORAL_CAPSULE | ORAL | Status: AC
  Administered 2024-09-22: 300 mg via ORAL

## 2024-09-22 MED ORDER — KETAMINE HCL 10 MG/ML IJ SOLN
INTRAMUSCULAR | Status: DC | PRN
  Administered 2024-09-22: 30 mg via INTRAVENOUS
  Administered 2024-09-22: 10 mg via INTRAVENOUS

## 2024-09-22 MED ORDER — MIDAZOLAM HCL 5 MG/5ML IJ SOLN
INTRAMUSCULAR | Status: DC | PRN
  Administered 2024-09-22: 2 mg via INTRAVENOUS

## 2024-09-22 MED ORDER — SCOPOLAMINE 1 MG/3DAYS TD PT72
MEDICATED_PATCH | TRANSDERMAL | Status: AC
  Filled 2024-09-22: qty 1

## 2024-09-22 MED ORDER — ONDANSETRON HCL 4 MG/2ML IJ SOLN
INTRAMUSCULAR | Status: DC | PRN
Start: 2024-09-22 — End: 2024-09-22
  Administered 2024-09-22: 4 mg via INTRAVENOUS

## 2024-09-22 MED ORDER — OXYCODONE HCL 5 MG PO TABS: 5.0000 mg | ORAL_TABLET | Freq: Once | ORAL | Status: DC | PRN

## 2024-09-22 MED ORDER — SODIUM BICARBONATE 4.2 % IV SOLN
INTRAVENOUS | Status: AC
  Filled 2024-09-22: qty 20

## 2024-09-22 MED ORDER — SODIUM BICARBONATE 4.2 % IV SOLN
INTRAVENOUS | Status: DC | PRN
  Administered 2024-09-22: 350 mL

## 2024-09-22 MED ORDER — BUPIVACAINE HCL (PF) 0.5 % IJ SOLN
INTRAMUSCULAR | Status: AC
  Filled 2024-09-22: qty 30

## 2024-09-22 MED ORDER — SODIUM CHLORIDE 0.9 % IV SOLN
INTRAVENOUS | Status: DC | PRN
  Administered 2024-09-22: 500 mL

## 2024-09-22 MED ORDER — PROPOFOL 10 MG/ML IV BOLUS
INTRAVENOUS | Status: DC | PRN
  Administered 2024-09-22: 140 mg via INTRAVENOUS

## 2024-09-22 MED ORDER — ACETAMINOPHEN 500 MG PO TABS: 1000.0000 mg | ORAL_TABLET | ORAL | Status: AC

## 2024-09-22 MED ORDER — KETAMINE HCL 50 MG/5ML IJ SOSY
PREFILLED_SYRINGE | INTRAMUSCULAR | Status: AC
Start: 2024-09-22 — End: 2024-09-22
  Filled 2024-09-22: qty 5

## 2024-09-22 MED ORDER — EPINEPHRINE PF 1 MG/ML IJ SOLN
INTRAMUSCULAR | Status: AC
  Filled 2024-09-22: qty 1

## 2024-09-22 MED ORDER — MIDAZOLAM HCL 2 MG/2ML IJ SOLN
INTRAMUSCULAR | Status: AC
  Filled 2024-09-22: qty 2

## 2024-09-22 MED ORDER — PROPOFOL 10 MG/ML IV BOLUS
INTRAVENOUS | Status: AC
  Filled 2024-09-22: qty 20

## 2024-09-22 MED ORDER — CEFAZOLIN SODIUM-DEXTROSE 2-4 GM/100ML-% IV SOLN
INTRAVENOUS | Status: AC
  Filled 2024-09-22: qty 100

## 2024-09-22 MED ORDER — IBUPROFEN 600 MG PO TABS: 600.0000 mg | ORAL_TABLET | Freq: Four times a day (QID) | ORAL | 0 refills | Status: AC | PRN

## 2024-09-22 MED ORDER — CEFAZOLIN SODIUM-DEXTROSE 2-4 GM/100ML-% IV SOLN
2.0000 g | INTRAVENOUS | Status: AC
  Administered 2024-09-22: 2 g via INTRAVENOUS

## 2024-09-22 MED ORDER — ACETAMINOPHEN 500 MG PO TABS
1000.0000 mg | ORAL_TABLET | Freq: Once | ORAL | Status: AC
  Administered 2024-09-22: 1000 mg via ORAL

## 2024-09-22 MED ORDER — SCOPOLAMINE 1 MG/3DAYS TD PT72
1.0000 | MEDICATED_PATCH | TRANSDERMAL | Status: DC
  Administered 2024-09-22: 1 mg via TRANSDERMAL

## 2024-09-22 MED ORDER — LIDOCAINE HCL (PF) 1 % IJ SOLN
INTRAMUSCULAR | Status: AC
  Filled 2024-09-22: qty 60

## 2024-09-22 MED ORDER — LIDOCAINE HCL (CARDIAC) PF 100 MG/5ML IV SOSY
PREFILLED_SYRINGE | INTRAVENOUS | Status: DC | PRN
  Administered 2024-09-22: 40 mg via INTRAVENOUS

## 2024-09-22 MED ORDER — OXYCODONE HCL 5 MG/5ML PO SOLN: 5.0000 mg | Freq: Once | ORAL | Status: DC | PRN

## 2024-09-22 MED ORDER — AMISULPRIDE (ANTIEMETIC) 5 MG/2ML IV SOLN
INTRAVENOUS | Status: AC
Start: 2024-09-22 — End: 2024-09-22
  Filled 2024-09-22: qty 4

## 2024-09-22 MED ORDER — PROPOFOL 500 MG/50ML IV EMUL
INTRAVENOUS | Status: DC | PRN
  Administered 2024-09-22: 150 ug/kg/min via INTRAVENOUS

## 2024-09-22 MED ORDER — SUGAMMADEX SODIUM 200 MG/2ML IV SOLN
INTRAVENOUS | Status: DC | PRN
  Administered 2024-09-22: 150 mg via INTRAVENOUS

## 2024-09-22 MED ORDER — FENTANYL CITRATE (PF) 100 MCG/2ML IJ SOLN
INTRAMUSCULAR | Status: AC
  Filled 2024-09-22: qty 2

## 2024-09-22 MED ORDER — ACETAMINOPHEN 500 MG PO TABS
ORAL_TABLET | ORAL | Status: AC
  Filled 2024-09-22: qty 2

## 2024-09-22 MED ORDER — AMISULPRIDE (ANTIEMETIC) 5 MG/2ML IV SOLN
10.0000 mg | Freq: Once | INTRAVENOUS | Status: AC | PRN
  Administered 2024-09-22: 10 mg via INTRAVENOUS

## 2024-09-22 SURGICAL SUPPLY — 63 items
BAG DECANTER FOR FLEXI CONT (MISCELLANEOUS) ×2 IMPLANT
BINDER ABD UNIV 9 30-45 (GAUZE/BANDAGES/DRESSINGS) IMPLANT
BINDER ABDOMINAL 10 UNV 27-48 (MISCELLANEOUS) IMPLANT
BINDER BREAST LRG (GAUZE/BANDAGES/DRESSINGS) IMPLANT
BINDER BREAST XLRG (GAUZE/BANDAGES/DRESSINGS) IMPLANT
BLADE SURG 10 STRL SS (BLADE) ×2 IMPLANT
BLADE SURG 11 STRL SS (BLADE) ×2 IMPLANT
BNDG ELASTIC 6INX 5YD STR LF (GAUZE/BANDAGES/DRESSINGS) IMPLANT
CANISTER LIPO FAT HARVEST (MISCELLANEOUS) ×2 IMPLANT
CANISTER SUCT 1200ML W/VALVE (MISCELLANEOUS) ×2 IMPLANT
CHLORAPREP W/TINT 26 (MISCELLANEOUS) ×2 IMPLANT
COVER BACK TABLE 60X90IN (DRAPES) ×2 IMPLANT
COVER MAYO STAND STRL (DRAPES) ×4 IMPLANT
DERMABOND ADVANCED .7 DNX12 (GAUZE/BANDAGES/DRESSINGS) ×4 IMPLANT
DRAIN CHANNEL 15F RND FF W/TCR (WOUND CARE) ×2 IMPLANT
DRAPE TOP ARMCOVERS (MISCELLANEOUS) ×2 IMPLANT
DRAPE U-SHAPE 76X120 STRL (DRAPES) ×2 IMPLANT
DRAPE UTILITY XL STRL (DRAPES) ×2 IMPLANT
ELECT COATED BLADE 2.86 ST (ELECTRODE) ×2 IMPLANT
ELECTRODE BLDE 4.0 EZ CLN MEGD (MISCELLANEOUS) ×2 IMPLANT
ELECTRODE REM PT RTRN 9FT ADLT (ELECTROSURGICAL) ×2 IMPLANT
EVACUATOR SILICONE 100CC (DRAIN) ×2 IMPLANT
GAUZE PAD ABD 8X10 STRL (GAUZE/BANDAGES/DRESSINGS) ×4 IMPLANT
GLOVE BIO SURGEON STRL SZ 6 (GLOVE) ×2 IMPLANT
GLOVE BIOGEL PI IND STRL 6.5 (GLOVE) IMPLANT
GLOVE BIOGEL PI IND STRL 7.0 (GLOVE) IMPLANT
GLOVE SURG SYN 7.0 PF PI (GLOVE) IMPLANT
GOWN STRL REUS W/ TWL LRG LVL3 (GOWN DISPOSABLE) ×4 IMPLANT
IMPL BREAST P6.1XRND LO 495 (Breast) IMPLANT
IV NS 500ML BAXH (IV SOLUTION) ×2 IMPLANT
LINER CANISTER 1000CC FLEX (MISCELLANEOUS) ×2 IMPLANT
MARKER SKIN DUAL TIP RULER LAB (MISCELLANEOUS) IMPLANT
NDL HYPO 25X1 1.5 SAFETY (NEEDLE) IMPLANT
NEEDLE HYPO 25X1 1.5 SAFETY (NEEDLE) ×2 IMPLANT
PACK BASIN DAY SURGERY FS (CUSTOM PROCEDURE TRAY) ×2 IMPLANT
PAD ALCOHOL SWAB (MISCELLANEOUS) ×2 IMPLANT
PENCIL SMOKE EVACUATOR (MISCELLANEOUS) ×2 IMPLANT
PIN SAFETY STERILE (MISCELLANEOUS) ×2 IMPLANT
SHEET MEDIUM DRAPE 40X70 STRL (DRAPES) ×4 IMPLANT
SIZER BRST P6.1X 470CC (SIZER) IMPLANT
SLEEVE SCD COMPRESS KNEE MED (STOCKING) ×2 IMPLANT
SOLN 0.9% NACL POUR BTL 1000ML (IV SOLUTION) IMPLANT
SPONGE T-LAP 18X18 ~~LOC~~+RFID (SPONGE) ×4 IMPLANT
STAPLER SKIN PROX WIDE 3.9 (STAPLE) ×2 IMPLANT
SUT ETHILON 2 0 FS 18 (SUTURE) IMPLANT
SUT MNCRL AB 4-0 PS2 18 (SUTURE) IMPLANT
SUT PDS AB 2-0 CT2 27 (SUTURE) IMPLANT
SUT PDS II 3-0 CT2 27 ABS (SUTURE) IMPLANT
SUT VIC AB 3-0 PS1 18XBRD (SUTURE) IMPLANT
SUT VIC AB 3-0 SH 27X BRD (SUTURE) ×2 IMPLANT
SUT VIC AB 4-0 PS2 18 (SUTURE) IMPLANT
SYR 10ML LL (SYRINGE) ×8 IMPLANT
SYR 20ML LL LF (SYRINGE) IMPLANT
SYR 50ML LL SCALE MARK (SYRINGE) ×2 IMPLANT
SYR BULB IRRIG 60ML STRL (SYRINGE) ×2 IMPLANT
SYR CONTROL 10ML LL (SYRINGE) IMPLANT
SYR TB 1ML LL NO SAFETY (SYRINGE) ×2 IMPLANT
TOWEL GREEN STERILE FF (TOWEL DISPOSABLE) ×4 IMPLANT
TUBE CONNECTING 20X1/4 (TUBING) ×2 IMPLANT
TUBING INFILTRATION IT-10001 (TUBING) ×2 IMPLANT
TUBING SET GRADUATE ASPIR 12FT (MISCELLANEOUS) ×2 IMPLANT
UNDERPAD 30X36 HEAVY ABSORB (UNDERPADS AND DIAPERS) ×4 IMPLANT
YANKAUER SUCT BULB TIP NO VENT (SUCTIONS) ×2 IMPLANT

## 2024-09-22 NOTE — Anesthesia Postprocedure Evaluation (Signed)
 Anesthesia Post Note  Patient: Jessica Campos  Procedure(s) Performed: REMOVAL, TISSUE EXPANDER, BREAST, WITH IMPLANT INSERTION (Bilateral: Breast) LIPOSUCTION, WITH FAT TRANSFER (Bilateral: Abdomen)     Patient location during evaluation: PACU Anesthesia Type: General Level of consciousness: awake and alert Pain management: pain level controlled Vital Signs Assessment: post-procedure vital signs reviewed and stable Respiratory status: spontaneous breathing, nonlabored ventilation, respiratory function stable and patient connected to nasal cannula oxygen Cardiovascular status: blood pressure returned to baseline and stable Postop Assessment: no apparent nausea or vomiting Anesthetic complications: no   No notable events documented.  Last Vitals:  Vitals:   09/22/24 1015 09/22/24 1036  BP: 137/87 (!) 146/92  Pulse: 78 (!) 54  Resp: (!) 21 16  Temp:  (!) 36.2 C  SpO2: 100% 100%    Last Pain:  Vitals:   09/22/24 1036  TempSrc:   PainSc: 5                  Jessica Campos

## 2024-09-22 NOTE — Transfer of Care (Signed)
 Immediate Anesthesia Transfer of Care Note  Patient: Jessica Campos  Procedure(s) Performed: REMOVAL, TISSUE EXPANDER, BREAST, WITH IMPLANT INSERTION (Bilateral: Breast) LIPOSUCTION, WITH FAT TRANSFER (Bilateral: Abdomen)  Patient Location: PACU  Anesthesia Type:General  Level of Consciousness: drowsy, patient cooperative, and responds to stimulation  Airway & Oxygen Therapy: Patient Spontanous Breathing and Patient connected to face mask oxygen  Post-op Assessment: Report given to RN and Post -op Vital signs reviewed and stable  Post vital signs: Reviewed and stable  Last Vitals:  Vitals Value Taken Time  BP 131/95 09/22/24 09:47  Temp    Pulse 78 09/22/24 09:47  Resp 20 09/22/24 09:47  SpO2 100 % 09/22/24 09:47  Vitals shown include unfiled device data.  Last Pain:  Vitals:   09/22/24 0655  TempSrc: Temporal  PainSc: 0-No pain      Patients Stated Pain Goal: 3 (09/22/24 9344)  Complications: No notable events documented.

## 2024-09-22 NOTE — H&P (Signed)
 Subjective  Patient ID: Jessica Campos is a 43 y.o. female.  HPI  4.5 mo post op. Here for implant exchange.  Presented following screening MMG with concern possible right breast mass and left breast asymmetry. Bilateral diagnostic MMG/right breast US  demonstrated resolution left breast asymmetry, no cystic or solid seen at the site of mammographic concern right breast. Incidental right breast mass at 10 o'clock 2 cmfn measuring 5 x 3 x 6 mm. Axilla not examined. Biopsy showed intermediate grade DCIS, ER/PR+.   Patient desired bilateral mastectomies. Final pathology right mastectomy foci intermediate grade DCIS, margins clear.  Genetics with VUS SUFU gene.   Prior 36B, desires larger. 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 Lives with spouse and two sons.  Review of Systems  Objective  Physical Exam  Cardiovascular: Normal rate, regular rhythm and normal heart sounds.   Pulmonary/Chest Effort normal and breath sounds normal.   Chest: scars maturing No seroma no cellulitis appreciated SN to nipple R 23 L 24 cm Nipple to IMF R 7 L 7 cm  Abd: infraumbilical abdomen with striae and redundant skin, supraumbilical abdomen and flanks without striae  Assessment/Plan  Ductal carcinoma in situ (DCIS) of right breast S/p bilateral NSM prepectoral TE/ADM (Alloderm) reconstruction  Plan removal bilateral chest tissue expander and placement silicone implants, lipofilling to chest.   Reviewed saline vs silicone, shaped vs round, textured vs smooth. Recommend HCG or capacity filled silicone implants as they may offer reduced risk visible rippling. Reviewed MRI or US  surveillance for rupture with silicone implants. Reviewed risks BIA ALCL with textured devices, new reports SCC implant associated with breast implants. Reviewed shared risks rupture contracture need for additional surgery. Counseled implants are not permanent  devices. Reviewed size in part guided by width chest, cannot assure her cup size. Patient has elected for silicone. Plan smooth round. Completed [place] physician patient checklist.   Discussed fat grafting to aid with contour and limit visible rippling. Reviewed variable take graft, may need to repeat, fat necrosis that can present as masses. Reviewed pain with procedure and need to compression post liposuction. Patient desires to proceed. Recommend she purchase Spanx type garment for post op use. We discussed flank and upper abdomen donor site may have less risk redundant skin. If desires can use infraumbilical abdomen but risks deflated or more redundant skin.  Additional risks including but not limited to bleeding, hematoma, seroma, infection, damage to adjacent structures, need for additional procedures, asymmetry, unacceptable cosmetic result, blood clots in legs or lungs reviewed.  Jessica Ranks, MD MBA Plastic & Reconstructive Surgery  Office/ physician access line after hours [phone]    Natrelle 133S-FV-12 T 400 ml tissue expanders placed bilateral  RIGHT fill volume 400 ml saline LEFT fill volume 360 ml saline

## 2024-09-22 NOTE — Op Note (Signed)
 Operative Note   DATE OF OPERATION: 11.18.2025  LOCATION: [facility]-outpatient  SURGICAL DIVISION: Plastic Surgery  PREOPERATIVE DIAGNOSES:  1. History DCIS 2. Acquired absence breasts  POSTOPERATIVE DIAGNOSES:  same  PROCEDURE:  1. Removal bilateral tissue expanders and placement silicone implants 2. Lipofilling to bilateral chest total 100 ml  SURGEON: Earlis Ranks MD MBA  ASSISTANT: none  ANESTHESIA:  General.   EBL: 50 ml  COMPLICATIONS: None immediate.   INDICATIONS FOR PROCEDURE:  The patient, Jessica Campos, is a 43 y.o. female born on January 09, 1981, is here for staged breast reconstruction following nipple sparing mastectomies and tissue expander based reconstruction.   FINDINGS: Complete incorporation ADM bilateral. Total 50 ml fat infiltrated over right mastectomy flap, 50 ml fat infiltrated over left mastectomy flap. Natrelle Soft Touch Extra Projection implants 495 ml placed bilateral REF [id number] RIGHT SN [id number] LEFT SN [id number]   DESCRIPTION OF PROCEDURE:  DESCRIPTION OF PROCEDURE:  The patient's operative site was marked with the patient in the preoperative area including supra and infra umbilical abdomen and bilateral flanks for liposuction. The patient was taken to the operating room. SCDs were placed and IV antibiotics were given. The patient's operative site was prepped and draped in a sterile fashion. A time out was performed and all information was confirmed to be correct.    Stab incision made over bilateral abdomen in prior scars.Tumescent solution infiltrated over supra umbilical abdomen and bilateral flanks. Total of 350 ml fluid infiltrated.    Over left chest, incision made through prior IMF scar and carried through superficial fascia and capsule. Tissue expander removed. Sizer placed.    Over right chest, incision made through prior IMF scar and carried through superficial fascia and capsule. Tissue expander removed. Sizer placed. Patient  assessed for symmetry. An extra projection 495 ml implant selected for bilateral placement.    Power assisted liposuction completed over bilateral flanks and supra and infra umbilical abdomen to endpoint of symmetric contour and soft tissue thickness. Fat prepared for infiltration with washing and gravity. Stab incisions of abdomen approximated with interrupted 4-0 monocryl. Using blunt canula, prepared fat placed subcutaneously through out each mastectomy envelope. Fifty ml fat infiltrated over right mastectomy flap, 50  ml fat infiltrated over left mastectomy flap.    Breast cavities irrigated with saline solution containing ancef , Betadine, and gentamicin . Hemostasis obtained. Implant placed in right chest. Implant orientation ensured. Layered closure completed with 3-0 vicryl in capsule and superficial fascia, 4-0 vicryl placed in dermis, followed by running 4-0 monocryl subcuticular for skin closure. Over left chest implant placed. Similar closure performed.  Dermabond applied to chest incisions. Dry dressing, and breast and abdominal binders placed.    The patient was allowed to wake from anesthesia, extubated and taken to the recovery room in satisfactory condition.   SPECIMENS: none  DRAINS: none  Earlis Ranks, MD [facility] Plastic & Reconstructive Surgery  Office/ physician access line after hours [phone]

## 2024-09-22 NOTE — Anesthesia Procedure Notes (Signed)
 Procedure Name: Intubation Date/Time: 09/22/2024 7:45 AM  Performed by: Frost Kayla MATSU, CRNAPre-anesthesia Checklist: Patient identified, Emergency Drugs available, Suction available and Patient being monitored Patient Re-evaluated:Patient Re-evaluated prior to induction Oxygen Delivery Method: Circle system utilized Preoxygenation: Pre-oxygenation with 100% oxygen Induction Type: IV induction Ventilation: Mask ventilation without difficulty Laryngoscope Size: Miller and 3 Grade View: Grade II Tube type: Oral Tube size: 7.0 mm Number of attempts: 1 Airway Equipment and Method: Stylet and Oral airway Placement Confirmation: ETT inserted through vocal cords under direct vision, positive ETCO2 and breath sounds checked- equal and bilateral Secured at: 20 cm Tube secured with: Tape Dental Injury: Teeth and Oropharynx as per pre-operative assessment

## 2024-09-22 NOTE — Discharge Instructions (Addendum)
*  No Tylenol  until after 1:00 pm *No Ibuprofen /Advil  until after 3:00 pm   Post Anesthesia Home Care Instructions  Activity: Get plenty of rest for the remainder of the day. A responsible individual must stay with you for 24 hours following the procedure.  For the next 24 hours, DO NOT: -Drive a car -Advertising copywriter -Drink alcoholic beverages -Take any medication unless instructed by your physician -Make any legal decisions or sign important papers.  Meals: Start with liquid foods such as gelatin or soup. Progress to regular foods as tolerated. Avoid greasy, spicy, heavy foods. If nausea and/or vomiting occur, drink only clear liquids until the nausea and/or vomiting subsides. Call your physician if vomiting continues.  Special Instructions/Symptoms: Your throat may feel dry or sore from the anesthesia or the breathing tube placed in your throat during surgery. If this causes discomfort, gargle with warm salt water . The discomfort should disappear within 24 hours.  If you had a scopolamine  patch placed behind your ear for the management of post- operative nausea and/or vomiting:  1. The medication in the patch is effective for 72 hours, after which it should be removed.  Wrap patch in a tissue and discard in the trash. Wash hands thoroughly with soap and water . 2. You may remove the patch earlier than 72 hours if you experience unpleasant side effects which may include dry mouth, dizziness or visual disturbances. 3. Avoid touching the patch. Wash your hands with soap and water  after contact with the patch.

## 2024-09-23 ENCOUNTER — Encounter (HOSPITAL_BASED_OUTPATIENT_CLINIC_OR_DEPARTMENT_OTHER): Payer: Self-pay | Admitting: Plastic Surgery

## 2024-10-07 ENCOUNTER — Encounter: Admitting: Internal Medicine

## 2024-10-07 DIAGNOSIS — E785 Hyperlipidemia, unspecified: Secondary | ICD-10-CM

## 2024-10-07 DIAGNOSIS — E559 Vitamin D deficiency, unspecified: Secondary | ICD-10-CM

## 2024-10-07 DIAGNOSIS — I10 Essential (primary) hypertension: Secondary | ICD-10-CM

## 2024-10-08 ENCOUNTER — Ambulatory Visit: Payer: Self-pay | Admitting: Internal Medicine

## 2024-10-08 ENCOUNTER — Ambulatory Visit: Admitting: Internal Medicine

## 2024-10-08 VITALS — BP 110/80 | HR 80 | Temp 97.6°F | Ht 62.0 in | Wt 164.9 lb

## 2024-10-08 DIAGNOSIS — Z853 Personal history of malignant neoplasm of breast: Secondary | ICD-10-CM

## 2024-10-08 DIAGNOSIS — I10 Essential (primary) hypertension: Secondary | ICD-10-CM

## 2024-10-08 DIAGNOSIS — E782 Mixed hyperlipidemia: Secondary | ICD-10-CM

## 2024-10-08 DIAGNOSIS — E559 Vitamin D deficiency, unspecified: Secondary | ICD-10-CM

## 2024-10-08 DIAGNOSIS — E669 Obesity, unspecified: Secondary | ICD-10-CM

## 2024-10-08 DIAGNOSIS — E785 Hyperlipidemia, unspecified: Secondary | ICD-10-CM

## 2024-10-08 DIAGNOSIS — Z0000 Encounter for general adult medical examination without abnormal findings: Secondary | ICD-10-CM

## 2024-10-08 LAB — LIPID PANEL
Cholesterol: 219 mg/dL — ABNORMAL HIGH (ref 0–200)
HDL: 71.2 mg/dL (ref >39.00)
LDL Cholesterol: 136 mg/dL — ABNORMAL HIGH (ref 0–99)
NonHDL: 147.48
Total CHOL/HDL Ratio: 3
Triglycerides: 55 mg/dL (ref 0.0–149.0)
VLDL: 11 mg/dL (ref 0.0–40.0)

## 2024-10-08 LAB — COMPREHENSIVE METABOLIC PANEL WITH GFR
ALT: 15 U/L (ref 0–35)
AST: 18 U/L (ref 0–37)
Albumin: 4.6 g/dL (ref 3.5–5.2)
Alkaline Phosphatase: 34 U/L — ABNORMAL LOW (ref 39–117)
BUN: 15 mg/dL (ref 6–23)
CO2: 27 meq/L (ref 19–32)
Calcium: 9 mg/dL (ref 8.4–10.5)
Chloride: 102 meq/L (ref 96–112)
Creatinine, Ser: 0.77 mg/dL (ref 0.40–1.20)
GFR: 93.55 mL/min (ref >60.00)
Glucose, Bld: 77 mg/dL (ref 70–99)
Potassium: 4.4 meq/L (ref 3.5–5.1)
Sodium: 137 meq/L (ref 135–145)
Total Bilirubin: 0.5 mg/dL (ref 0.2–1.2)
Total Protein: 6.8 g/dL (ref 6.0–8.3)

## 2024-10-08 LAB — CBC WITH DIFFERENTIAL/PLATELET
Basophils Absolute: 0.1 K/uL (ref 0.0–0.1)
Basophils Relative: 1.1 % (ref 0.0–3.0)
Eosinophils Absolute: 0.1 K/uL (ref 0.0–0.7)
Eosinophils Relative: 1.7 % (ref 0.0–5.0)
HCT: 36.3 % (ref 36.0–46.0)
Hemoglobin: 12.3 g/dL (ref 12.0–15.0)
Lymphocytes Relative: 35.4 % (ref 12.0–46.0)
Lymphs Abs: 1.8 K/uL (ref 0.7–4.0)
MCHC: 33.9 g/dL (ref 30.0–36.0)
MCV: 87.7 fl (ref 78.0–100.0)
Monocytes Absolute: 0.4 K/uL (ref 0.1–1.0)
Monocytes Relative: 7.9 % (ref 3.0–12.0)
Neutro Abs: 2.8 K/uL (ref 1.4–7.7)
Neutrophils Relative %: 53.9 % (ref 43.0–77.0)
Platelets: 397 K/uL (ref 150.0–400.0)
RBC: 4.14 Mil/uL (ref 3.87–5.11)
RDW: 14.5 % (ref 11.5–15.5)
WBC: 5.1 K/uL (ref 4.0–10.5)

## 2024-10-08 LAB — TSH: TSH: 0.67 u[IU]/mL (ref 0.35–5.50)

## 2024-10-08 LAB — VITAMIN B12: Vitamin B-12: 565 pg/mL (ref 211–911)

## 2024-10-08 LAB — VITAMIN D 25 HYDROXY (VIT D DEFICIENCY, FRACTURES): VITD: 31.74 ng/mL (ref 30.00–100.00)

## 2024-10-08 NOTE — Progress Notes (Signed)
 Established Patient Office Visit     CC/Reason for Visit: Annual preventive exam  HPI: Jessica Campos is a 43 y.o. female who is coming in today for the above mentioned reasons. Past Medical History is significant for: She was diagnosed with breast cancer this year and had a double mastectomy followed by reconstruction.  Also has a history of hypertension, hyperlipidemia and vitamin D  deficiency.  Has been feeling well.  Is scheduled for an eye exam and has routine dental care.  Is due for COVID-vaccine.   Past Medical/Surgical History: Past Medical History:  Diagnosis Date   Ankle fracture 2005   Right   Anxiety    Breast cancer (HCC)    Elevated blood pressure reading    after second c section, resolved on its own, not currently on BP medications   Fibroids    High cholesterol    Joint pain    Obesity (BMI 30-39.9)    PONV (postoperative nausea and vomiting)    Seasonal allergies    Vitamin D  deficiency     Past Surgical History:  Procedure Laterality Date   ANKLE SURGERY Right 2005   BREAST BIOPSY Right 02/25/2024   US  RT BREAST BX W LOC DEV 1ST LESION IMG BX SPEC US  GUIDE 02/25/2024 [facility]   BREAST RECONSTRUCTION Bilateral 05/12/2024   Procedure: BILTERAL BREAST RECONSTRUCTION WITH TISSUE EXPANDERS AND ACELLULAR DERMIS;  Surgeon: Arelia Filippo, MD;  Location: [facility];  Service: Plastics;  Laterality: Bilateral;   CESAREAN SECTION     x2   FOOT SURGERY Right 2013   LIPOSUCTION WITH LIPOFILLING Bilateral 09/22/2024   Procedure: LIPOSUCTION, WITH FAT TRANSFER;  Surgeon: Arelia Filippo, MD;  Location: [facility];  Service: Plastics;  Laterality: Bilateral;   NIPPLE SPARING MASTECTOMY Bilateral 05/12/2024   Procedure: RIGHT NIPPLE SPARING MASTECYOMY, LEFT RISK REDUCING NIPPLE SPARING MASTECTOMY;  Surgeon: Vanderbilt Ned, MD;  Location: [facility];  Service: General;  Laterality: Bilateral;  BILATERAL  NIPPLE SPARING MASTECTOMY GEN w/PEC BLOCK   PARTIAL HYSTERECTOMY     REMOVAL OF TISSUE EXPANDER AND PLACEMENT OF IMPLANT Bilateral 09/22/2024   Procedure: REMOVAL, TISSUE EXPANDER, BREAST, WITH IMPLANT INSERTION;  Surgeon: Arelia Filippo, MD;  Location: [facility];  Service: Plastics;  Laterality: Bilateral;    Social History:  reports that she has never smoked. She has never used smokeless tobacco. She reports current alcohol use. She reports that she does not use drugs.  Allergies: No Known Allergies  Family History:  Family History  Problem Relation Age of Onset   Hyperlipidemia Mother    Hypertension Mother    Obesity Mother    Obesity Father    Stroke Father    Hyperlipidemia Father    Diabetes Father    Hypertension Father    Diabetes type II Father    Alcoholism Father    Prostate cancer Maternal Uncle 24   Prostate cancer Maternal Uncle 4       metastatic   Breast cancer Paternal Aunt 50       recurred at 60   Breast cancer Other 26       maternal great aunt     Current Outpatient Medications:    amLODipine  (NORVASC ) 2.5 MG tablet, Take 1 tablet (2.5 mg total) by mouth daily., Disp: 90 tablet, Rfl: 1   Boric Acid 600 MG SUPP, , Disp: , Rfl:    ibuprofen  (ADVIL ) 600 MG tablet, Take 1 tablet (600  mg total) by mouth every 6 (six) hours as needed., Disp: 25 tablet, Rfl: 0   Multiple Vitamin (MULTIVITAMIN) tablet, Take 1 tablet by mouth daily., Disp: , Rfl:    Omega-3 Fatty Acids (FISH OIL) 1000 MG CAPS, , Disp: , Rfl:    Vitamin D , Ergocalciferol , (DRISDOL ) 1.25 MG (50000 UNIT) CAPS capsule, Take 1 capsule (50,000 Units total) by mouth every 7 (seven) days., Disp: 12 capsule, Rfl: 0  Review of Systems:  Negative unless indicated in HPI.   Physical Exam: Vitals:   10/08/24 0856  BP: 110/80  Pulse: 80  Temp: 97.6 F (36.4 C)  TempSrc: Oral  SpO2: 98%  Weight: 164 lb 14.4 oz (74.8 kg)  Height: 5' 2 (1.575 m)    Body mass index is 30.16  kg/m.   Physical Exam Vitals reviewed.  Constitutional:      General: She is not in acute distress.    Appearance: Normal appearance. She is not ill-appearing, toxic-appearing or diaphoretic.  HENT:     Head: Normocephalic.     Right Ear: Tympanic membrane, ear canal and external ear normal. There is no impacted cerumen.     Left Ear: Tympanic membrane, ear canal and external ear normal. There is no impacted cerumen.     Nose: Nose normal.     Mouth/Throat:     Mouth: Mucous membranes are moist.     Pharynx: Oropharynx is clear. No oropharyngeal exudate or posterior oropharyngeal erythema.  Eyes:     General: No scleral icterus.       Right eye: No discharge.        Left eye: No discharge.     Conjunctiva/sclera: Conjunctivae normal.     Pupils: Pupils are equal, round, and reactive to light.  Neck:     Vascular: No carotid bruit.  Cardiovascular:     Rate and Rhythm: Normal rate and regular rhythm.     Pulses: Normal pulses.     Heart sounds: Normal heart sounds.  Pulmonary:     Effort: Pulmonary effort is normal. No respiratory distress.     Breath sounds: Normal breath sounds.  Abdominal:     General: Abdomen is flat. Bowel sounds are normal.     Palpations: Abdomen is soft.  Musculoskeletal:        General: Normal range of motion.     Cervical back: Normal range of motion.  Skin:    General: Skin is warm and dry.  Neurological:     General: No focal deficit present.     Mental Status: She is alert and oriented to person, place, and time. Mental status is at baseline.  Psychiatric:        Mood and Affect: Mood normal.        Behavior: Behavior normal.        Thought Content: Thought content normal.        Judgment: Judgment normal.      Impression and Plan:  Encounter for preventive health examination  Vitamin D  deficiency -     VITAMIN D  25 Hydroxy (Vit-D Deficiency, Fractures); Future  Primary hypertension -     CBC with Differential/Platelet;  Future -     Comprehensive metabolic panel with GFR; Future  Hyperlipidemia, unspecified hyperlipidemia type -     Lipid panel; Future  History of breast cancer  Obesity (BMI 30-39.9) -     TSH; Future -     Vitamin B12; Future   -Recommend routine eye and dental care. -  Healthy lifestyle discussed in detail. -Labs to be updated today. -Prostate cancer screening: Not applicable Health Maintenance  Topic Date Due   Hepatitis B Vaccine (1 of 3 - 19+ 3-dose series) Never done   HPV Vaccine (1 - Risk 3-dose SCDM series) Never done   Pap with HPV screening  05/20/2015   COVID-19 Vaccine (8 - 2025-26 season) 07/06/2024   DTaP/Tdap/Td vaccine (2 - Td or Tdap) 04/21/2025   Flu Shot  Completed   Hepatitis C Screening  Completed   HIV Screening  Completed   Pneumococcal Vaccine  Aged Out   Meningitis B Vaccine  Aged Out   Breast Cancer Screening  Discontinued     -Advised COVID vaccination at pharmacy.    Tully Theophilus Andrews, MD  Primary Care at [facility]

## 2024-10-12 ENCOUNTER — Encounter: Admitting: Internal Medicine

## 2024-10-20 ENCOUNTER — Ambulatory Visit: Admitting: Family Medicine

## 2024-10-20 ENCOUNTER — Encounter: Payer: Self-pay | Admitting: Family Medicine

## 2024-10-20 VITALS — BP 122/78 | HR 82 | Ht 62.0 in | Wt 161.0 lb

## 2024-10-20 DIAGNOSIS — E559 Vitamin D deficiency, unspecified: Secondary | ICD-10-CM

## 2024-10-20 DIAGNOSIS — E782 Mixed hyperlipidemia: Secondary | ICD-10-CM

## 2024-10-20 DIAGNOSIS — E663 Overweight: Secondary | ICD-10-CM

## 2024-10-20 DIAGNOSIS — Z853 Personal history of malignant neoplasm of breast: Secondary | ICD-10-CM

## 2024-10-20 MED ORDER — VITAMIN D (ERGOCALCIFEROL) 1.25 MG (50000 UNIT) PO CAPS: 50000.0000 [IU] | ORAL_CAPSULE | ORAL | 0 refills | Status: AC

## 2024-10-20 NOTE — Patient Instructions (Signed)
 Try out use of Chat GPT (or other AI search) to create a new meal plan: 1400 calorie whole foods plant based diet with lean beef, fish, eggs, and poutry  Referral to [place] RD made  Work on: adding back exercise once cleared by surgeon  Aim for 8 hrs of sleep at night and 90 oz of water  daily

## 2024-10-20 NOTE — Progress Notes (Signed)
 Office: [phone]  /  Fax: [phone]  WEIGHT SUMMARY AND BIOMETRICS  Starting Date: 01/20/24  Starting Weight: 163lb   Weight Lost Since Last Visit: 0lb   Vitals BP: 122/78 Pulse Rate: 82 SpO2: 100 %   Body Composition  Body Fat %: 35.2 % Fat Mass (lbs): 57 lbs Muscle Mass (lbs): 99.4 lbs Total Body Water  (lbs): 73.8 lbs Visceral Fat Rating : 7    HPI  Chief Complaint: OBESITY  Jessica Campos is here to discuss her progress with her obesity treatment plan. She is on the the Category 3 Plan and states she is following her eating plan approximately 0 % of the time. She states she is exercising 0 minutes 0 times per week.  Interval History:  Since last office visit she is up 3 lb She has been more tired. She did well with breast reconstructive surgery done 11/18 and has held off on resuming exercise until cleared by surgeon She has a net weight loss of 2 lb in 9 mos of medically supervised weight management Her goal is to get back down to 145 lb She has been eating out more  She plans to work on cooking more She will be going back to work soon  Pharmacotherapy: none Previous adverse SE on [place]   PHYSICAL EXAM:  Blood pressure 122/78, pulse 82, height 5' 2 (1.575 m), weight 161 lb (73 kg), last menstrual period 08/05/2018, SpO2 100%. Body mass index is 29.45 kg/m.  General: She is healthy appearing cooperative, alert, well developed, and in no acute distress. PSYCH: Has normal mood, affect and thought process.   Lungs: Normal breathing effort, no conversational dyspnea.   ASSESSMENT AND PLAN  TREATMENT PLAN FOR OBESITY:  Recommended Dietary Goals  Achol is currently in the action stage of change. As such, her goal is to continue weight management plan. She has agreed to changing to AI created 1400 calorie whole foods plant based diet with lean beef, eggs, fish and poultry - this is a change from her previous meal plan - REE (low) 1296 with plans to  resume workouts 30-60 min 3+ days/ wk  Behavioral Intervention  We discussed the following Behavioral Modification Strategies today: increasing lean protein intake to established goals, increasing fiber rich foods, increasing water  intake , work on meal planning and preparation, keeping healthy foods at home, continue to practice mindfulness when eating, planning for success, and avoid all-or-none thinking.  Additional resources provided today: NA  Recommended Physical Activity Goals  Darl has been advised to work up to 150 minutes of moderate intensity aerobic activity a week and strengthening exercises 2-3 times per week for cardiovascular health, weight loss maintenance and preservation of muscle mass.   She has agreed to Patient will begin to exercise once cleared by surgeonaiming for cardio 5 days/ wk, resistance training 2-3 days/ wk  Pharmacotherapy changes for the treatment of obesity: none  ASSOCIATED CONDITIONS ADDRESSED TODAY  Vitamin D  deficiency Last vitamin D  Lab Results  Component Value Date   VD25OH 31.74 10/08/2024   Labs updated by PCP She is not on RX vitamin D  and complains of fatigie We discussed the importance of vitamin D   Restart RX vitamin D  once weekly and repeat lab this Spring -     Vitamin D  (Ergocalciferol ); Take 1 capsule (50,000 Units total) by mouth every 7 (seven) days.  Dispense: 12 capsule; Refill: 0  Overweight (BMI 25.0-29.9) -     Amb ref to Medical Nutrition Therapy-[facility] Given slow weight loss  with a BMI <30 and long term plan to not only achieve her target weight closer to 145 lb but to help her HLD, HTN and keep her on a low processed foods more whole foods plant based diet given hx of breast cancer, she agrees to an RD referral.  Mixed hyperlipidemia -     Amb ref to Medical Nutrition Therapy-[facility] Lab Results  Component Value Date   CHOL 219 (H) 10/08/2024   HDL 71.20 10/08/2024   LDLCALC 136 (H) 10/08/2024   TRIG 55.0 10/08/2024    CHOLHDL 3 10/08/2024   Taking an Omega 3 Fish oil supplement per PCP Follow up labs with PCP  History of breast cancer S/p bilateral mastectomy 05/12/24 with reconstructive surgery done 11/18, healing well. Happy w/ her results thus far, awaiting clearance to resume exercise.       She was informed of the importance of frequent follow up visits to maximize her success with intensive lifestyle modifications for her multiple health conditions.   ATTESTASTION STATEMENTS:  Reviewed by clinician on day of visit: allergies, medications, problem list, medical history, surgical history, family history, social history, and previous encounter notes pertinent to obesity diagnosis.   I have personally spent 30 minutes total time today in preparation, patient care, nutritional counseling and education,  and documentation for this visit, including the following: review of most recent clinical lab tests, prescribing medications/ refilling medications, reviewing medical assistant documentation, review and interpretation of bioimpedence results.     Darice Haddock, D.O. DABFM, DABOM [facility] [address] [place], [place] [address] [phone]

## 2024-10-21 ENCOUNTER — Telehealth: Payer: Self-pay | Admitting: *Deleted

## 2024-10-21 NOTE — Telephone Encounter (Signed)
 Referral sent to [facility] RD. Office notes and referral were faxed over [phone].

## 2024-12-14 ENCOUNTER — Ambulatory Visit: Admitting: Family Medicine

## 2025-01-05 ENCOUNTER — Encounter: Admitting: Internal Medicine
